# Patient Record
Sex: Female | Born: 1995 | Race: Black or African American | Hispanic: No | Marital: Single | Smoking: Never smoker
Health system: Southern US, Community
[De-identification: ages and names within clinical notes are randomized; demographics above are authoritative.]

## PROBLEM LIST (undated history)

## (undated) ENCOUNTER — Inpatient Hospital Stay (HOSPITAL_COMMUNITY): Payer: Self-pay

## (undated) DIAGNOSIS — Z8619 Personal history of other infectious and parasitic diseases: Secondary | ICD-10-CM

## (undated) DIAGNOSIS — K08409 Partial loss of teeth, unspecified cause, unspecified class: Secondary | ICD-10-CM

## (undated) HISTORY — PX: WISDOM TOOTH EXTRACTION: SHX21

---
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
## 2015-05-22 LAB — PROCEDURE REPORT - SCANNED: Pap: NEGATIVE

## 2015-06-11 ENCOUNTER — Encounter (HOSPITAL_COMMUNITY): Payer: Self-pay | Admitting: *Deleted

## 2015-06-11 ENCOUNTER — Inpatient Hospital Stay (HOSPITAL_COMMUNITY)
Admission: AD | Admit: 2015-06-11 | Discharge: 2015-06-11 | Disposition: A | Discharge status: Home / Self Care | Payer: Medicaid Other | Source: Ambulatory Visit | Attending: Obstetrics | Admitting: Obstetrics

## 2015-06-11 DIAGNOSIS — O98812 Other maternal infectious and parasitic diseases complicating pregnancy, second trimester: Secondary | ICD-10-CM | POA: Insufficient documentation

## 2015-06-11 DIAGNOSIS — B373 Candidiasis of vulva and vagina: Secondary | ICD-10-CM | POA: Insufficient documentation

## 2015-06-11 DIAGNOSIS — O23592 Infection of other part of genital tract in pregnancy, second trimester: Secondary | ICD-10-CM | POA: Diagnosis not present

## 2015-06-11 DIAGNOSIS — N760 Acute vaginitis: Secondary | ICD-10-CM | POA: Insufficient documentation

## 2015-06-11 DIAGNOSIS — R109 Unspecified abdominal pain: Secondary | ICD-10-CM | POA: Diagnosis not present

## 2015-06-11 DIAGNOSIS — Z3A16 16 weeks gestation of pregnancy: Secondary | ICD-10-CM | POA: Diagnosis not present

## 2015-06-11 DIAGNOSIS — R5383 Other fatigue: Secondary | ICD-10-CM | POA: Diagnosis present

## 2015-06-11 DIAGNOSIS — B3731 Acute candidiasis of vulva and vagina: Secondary | ICD-10-CM

## 2015-06-11 DIAGNOSIS — J029 Acute pharyngitis, unspecified: Secondary | ICD-10-CM | POA: Diagnosis not present

## 2015-06-11 LAB — URINALYSIS, ROUTINE W REFLEX MICROSCOPIC
BILIRUBIN URINE: NEGATIVE
GLUCOSE, UA: NEGATIVE mg/dL
HGB URINE DIPSTICK: NEGATIVE
Ketones, ur: NEGATIVE mg/dL
Nitrite: NEGATIVE
Protein, ur: NEGATIVE mg/dL
Specific Gravity, Urine: 1.025 (ref 1.005–1.030)
pH: 6 (ref 5.0–8.0)

## 2015-06-11 LAB — URINE MICROSCOPIC-ADD ON

## 2015-06-11 LAB — WET PREP, GENITAL
CLUE CELLS WET PREP: NONE SEEN
Sperm: NONE SEEN
TRICH WET PREP: NONE SEEN

## 2015-06-11 MED ORDER — FLUCONAZOLE 150 MG PO TABS: 150.0000 mg | ORAL_TABLET | Freq: Once | ORAL | Status: DC

## 2015-06-11 MED ORDER — ACETAMINOPHEN 500 MG PO TABS
1000.0000 mg | ORAL_TABLET | Freq: Once | ORAL | Status: AC
  Administered 2015-06-11: 1000 mg via ORAL
  Filled 2015-06-11: qty 2

## 2015-06-11 NOTE — MAU Note (Signed)
Neck was stiff yesterday and sore throat since yesterday.  Has a small white bump near vagina.  Has had abdominal pain and spotting for a little over a week. Denies LOF.

## 2015-06-11 NOTE — Discharge Instructions (Signed)

## 2015-06-11 NOTE — MAU Provider Note (Signed)
History     CSN: [id number]  Arrival date and time: 06/11/15 1243   First Provider Initiated Contact with Patient 06/11/15 1331      Chief Complaint  Patient presents with  . neck stiff   . Fatigue  . Sore Throat  . Abdominal Pain  . Vaginal Bleeding   HPI Comments: Amanda [name] is a 20 y.o. G2P1001 at 3830w6d by outside [place] presenting with H/A, N/occasional vomiting, not feeling well, spotting for over 1 month, and concerned about possible STI (but states cultures done 3 wks ago and no sexual activity since).   ([place]    Past Medical History  Diagnosis Date  . Hx of wisdom tooth extraction   . Hx of chlamydia infection 05/04/14    treated, TOC-neg  . Hx of gonorrhea 05/04/14    treated, TOC-neg    Past Surgical History  Procedure Laterality Date  . Wisdom tooth extraction      History reviewed. No pertinent family history.  Social History  Substance Use Topics  . Smoking status: Never Smoker   . Smokeless tobacco: None  . Alcohol Use: No    Allergies: No Known Allergies  No prescriptions prior to admission    Review of Systems  Constitutional: Negative for fever and chills.  HENT: Positive for sore throat.        Frontal pressure H/A. Took a Tylenol yesterday with some relief. No tx today. Had sore throat yesterday, better today. Neck was stiff yesterday, now resolved.  Respiratory: Negative for shortness of breath.   Cardiovascular: Negative for chest pain.  Gastrointestinal: Positive for nausea, vomiting and abdominal pain.  Genitourinary: Negative for dysuria, urgency, frequency, hematuria and flank pain.       Irritative and pruritic white vaginal discharge  Neurological: Positive for headaches. Negative for dizziness.  Psychiatric/Behavioral: Positive for depression.   Physical Exam   Blood pressure 107/67, pulse 101, temperature 97.5 F (36.4 C), temperature source Oral, resp. rate 16, height 5' 1.5" (1.562 m), weight 131 lb 12.8 oz  (59.784 kg), last menstrual period 11/02/2014, not currently breastfeeding. DT FHR 160  Physical Exam  Nursing note and vitals reviewed. Constitutional: She is oriented to person, place, and time. She appears well-developed and well-nourished. No distress.  HENT:  Head: Normocephalic.  Eyes: Pupils are equal, round, and reactive to light.  Throa clear. No neck adenopathy  Neck: Normal range of motion. Neck supple. No tracheal deviation present.  Cardiovascular: Normal rate.   Respiratory: Effort normal.  GI: Soft. There is no tenderness. There is no guarding.  Fundus at [id number]  Genitourinary: Vaginal discharge found.  Mod white D/C. No blood.  Musculoskeletal: Normal range of motion.  Neurological: She is alert and oriented to person, place, and time.  Skin: Skin is warm and dry.  Psychiatric: She has a normal mood and affect.  Spec: Moderate amount thick white discharge, vaginal mucosa normal withuot lesions, cx clean, No blood SVE: cx long, closed MAU Course  Procedures Results for orders placed or performed during the hospital encounter of 06/11/15 (from the past 24 hour(s))  Urinalysis, Routine w reflex microscopic (not [facility])     Status: Abnormal   Collection Time: 06/11/15 12:43 PM  Result Value Ref Range   Color, Urine AMBER (A) YELLOW   APPearance CLEAR CLEAR   Specific Gravity, Urine 1.025 1.005 - 1.030   pH 6.0 5.0 - 8.0   Glucose, UA NEGATIVE NEGATIVE mg/dL   Hgb urine dipstick  NEGATIVE NEGATIVE   Bilirubin Urine NEGATIVE NEGATIVE   Ketones, ur NEGATIVE NEGATIVE mg/dL   Protein, ur NEGATIVE NEGATIVE mg/dL   Nitrite NEGATIVE NEGATIVE   Leukocytes, UA SMALL (A) NEGATIVE  Urine microscopic-add on     Status: Abnormal   Collection Time: 06/11/15 12:43 PM  Result Value Ref Range   Squamous Epithelial / LPF TOO NUMEROUS TO COUNT (A) NONE SEEN   WBC, UA 6-30 0 - 5 WBC/hpf   RBC / HPF 0-5 0 - 5 RBC/hpf   Bacteria, UA MANY (A) NONE SEEN   Urine-Other MUCOUS PRESENT    Wet prep, genital     Status: Abnormal   Collection Time: 06/11/15  2:01 PM  Result Value Ref Range   Yeast Wet Prep HPF POC PRESENT (A) NONE SEEN   Trich, Wet Prep NONE SEEN NONE SEEN   Clue Cells Wet Prep HPF POC NONE SEEN NONE SEEN   WBC, Wet Prep HPF POC MANY (A) NONE SEEN   Sperm NONE SEEN    Acetaminophen  po with relief of H?A  Assessment and Plan  Yeast vaginitis G2P1001 at [redacted]w[redacted]d,  Hx vaginal spotting, not evident H/A , tension    Medication List    TAKE these medications        acetaminophen 325 MG tablet  Commonly known as:  TYLENOL  Take 325 mg by mouth every 6 (six) hours as needed for moderate pain or headache.     fluconazole 150 MG tablet  Commonly known as:  DIFLUCAN  Take 1 tablet (150 mg total) by mouth once.     prenatal multivitamin Tabs tablet  Take 1 tablet by mouth daily at 12 noon.       Follow-up Information    Follow up with MARSHALL,BERNARD A, MD In 1 week.   Specialty:  Obstetrics and Gynecology   Why:  Keep your scheduled prenatal appointment   Contact information:   [address] [place] [address] [phone]     Advised to go get NOB lab panel today Amanda [name] 06/11/2015, 1:35 PM

## 2015-06-27 ENCOUNTER — Other Ambulatory Visit (HOSPITAL_COMMUNITY): Payer: Self-pay | Admitting: Obstetrics

## 2015-06-27 DIAGNOSIS — Z3A19 19 weeks gestation of pregnancy: Secondary | ICD-10-CM

## 2015-06-27 DIAGNOSIS — Z3689 Encounter for other specified antenatal screening: Secondary | ICD-10-CM

## 2015-07-01 ENCOUNTER — Ambulatory Visit (HOSPITAL_COMMUNITY): Admission: RE | Admit: 2015-07-01 | Payer: Medicaid Other | Source: Ambulatory Visit

## 2015-09-05 ENCOUNTER — Encounter: Payer: Self-pay | Admitting: Obstetrics

## 2015-09-06 ENCOUNTER — Ambulatory Visit (INDEPENDENT_AMBULATORY_CARE_PROVIDER_SITE_OTHER): Payer: Medicaid Other | Admitting: Certified Nurse Midwife

## 2015-09-06 ENCOUNTER — Encounter: Payer: Self-pay | Admitting: Certified Nurse Midwife

## 2015-09-06 VITALS — BP 98/57 | HR 80 | Wt 147.5 lb

## 2015-09-06 DIAGNOSIS — Z3490 Encounter for supervision of normal pregnancy, unspecified, unspecified trimester: Secondary | ICD-10-CM | POA: Insufficient documentation

## 2015-09-06 DIAGNOSIS — O09899 Supervision of other high risk pregnancies, unspecified trimester: Secondary | ICD-10-CM | POA: Insufficient documentation

## 2015-09-06 DIAGNOSIS — Z3480 Encounter for supervision of other normal pregnancy, unspecified trimester: Secondary | ICD-10-CM

## 2015-09-06 DIAGNOSIS — Z3483 Encounter for supervision of other normal pregnancy, third trimester: Secondary | ICD-10-CM | POA: Diagnosis present

## 2015-09-06 LAB — POCT URINALYSIS DIP (DEVICE)
BILIRUBIN URINE: NEGATIVE
Glucose, UA: NEGATIVE mg/dL
Hgb urine dipstick: NEGATIVE
Ketones, ur: NEGATIVE mg/dL
NITRITE: NEGATIVE
PH: 6.5 (ref 5.0–8.0)
Protein, ur: 30 mg/dL — AB
Specific Gravity, Urine: 1.025 (ref 1.005–1.030)
UROBILINOGEN UA: 1 mg/dL (ref 0.0–1.0)

## 2015-09-06 MED ORDER — TETANUS-DIPHTH-ACELL PERTUSSIS 5-2.5-18.5 LF-MCG/0.5 IM SUSP: 0.5000 mL | Freq: Once | INTRAMUSCULAR | Status: DC

## 2015-09-06 NOTE — Progress Notes (Signed)
Patient does not intend to breastfeed Need ROI from Dr Elsie [facility] Need Tdap

## 2015-09-06 NOTE — Progress Notes (Signed)
Amanda [name] is a G2P1001 6474w2d being seen today for her first obstetrical visit.  Her obstetrical history is significant for short pregnancy interval. Patient does not intend to breast feed. Pregnancy history fully reviewed.  Patient reports no complaints.  Filed Vitals:   09/06/15 0851  BP: 98/57  Pulse: 80  Weight: 147 lb 8 oz (66.906 kg)    HISTORY: OB History  Gravida Para Term Preterm AB SAB TAB Ectopic Multiple Living  2 1 1       0 1    # Outcome Date GA Lbr Len/2nd Weight Sex Delivery Anes PTL Lv  2 Current           1 Term 06/23/14 4930w6d 02:00 / 00:52 6 lb 14.1 oz (3.12 kg) M Vag-Spont EPI  Y     Past Medical History  Diagnosis Date  . Hx of wisdom tooth extraction   . Hx of chlamydia infection 05/04/14    treated, TOC-neg  . Hx of gonorrhea 05/04/14    treated, TOC-neg   Past Surgical History  Procedure Laterality Date  . Wisdom tooth extraction     Family History  Problem Relation Age of Onset  . Adopted: Yes  . Cancer Mother     pseudotumor in brain     Exam                                          Skin: normal coloration and turgor, no rashes    Neurologic: oriented, normal   Extremities: normal strength, tone, and muscle mass   HEENT PERRLA   Mouth/Teeth mucous membranes moist, pharynx normal without lesions   Neck supple   Cardiovascular: regular rate and rhythm, no murmurs or gallops   Respiratory:  appears well, vitals normal, no respiratory distress, acyanotic, normal RR, ear and throat exam is normal, chest clear, no wheezing, crepitations, rhonchi, normal symmetric air entry   Abdomen: soft, non-tender; bowel sounds normal; no masses,  no organomegaly   Urinary:       Assessment:    Pregnancy: G2P1001 Patient Active Problem List   Diagnosis Date Noted  . Supervision of normal pregnancy, antepartum 09/06/2015  . Short interval between pregnancies affecting pregnancy, antepartum 09/06/2015      Plan:   Request for  records Problem list reviewed and updated. Follow up in 2 weeks.   Donette LarryMelanie Estiben Mizuno, CNM 09/06/2015     Subjective:    Amanda [name] is a G2P1001 7274w2d being seen today for her first obstetrical visit.  Her obstetrical history is significant for short pregnancy interval. Patient does not intend to breast feed. Pregnancy history fully reviewed.  Patient reports no complaints.  Filed Vitals:   09/06/15 0851  BP: 98/57  Pulse: 80  Weight: 147 lb 8 oz (66.906 kg)    HISTORY: OB History  Gravida Para Term Preterm AB SAB TAB Ectopic Multiple Living  2 1 1       0 1    # Outcome Date GA Lbr Len/2nd Weight Sex Delivery Anes PTL Lv  2 Current           1 Term 06/23/14 7430w6d 02:00 / 00:52 6 lb 14.1 oz (3.12 kg) M Vag-Spont EPI  Y     Past Medical History  Diagnosis Date  . Hx of wisdom tooth extraction   . Hx of chlamydia infection 05/04/14    treated, TOC-neg  .  Hx of gonorrhea 05/04/14    treated, TOC-neg   Past Surgical History  Procedure Laterality Date  . Wisdom tooth extraction     Family History  Problem Relation Age of Onset  . Adopted: Yes  . Cancer Mother     pseudotumor in brain     Exam                                           Skin: normal coloration and turgor, no rashes    Neurologic: oriented, normal   Extremities: normal strength, tone, and muscle mass   HEENT PERRLA   Mouth/Teeth mucous membranes moist, pharynx normal without lesions   Neck supple   Cardiovascular: regular rate and rhythm, no murmurs or gallops   Respiratory:  appears well, vitals normal, no respiratory distress, acyanotic, normal RR, ear and throat exam is normal, chest clear, no wheezing, crepitations, rhonchi, normal symmetric air entry   Abdomen: soft, non-tender; bowel sounds normal; no masses,  no organomegaly   Urinary:       Assessment:    Pregnancy: G2P1001 Patient Active Problem List   Diagnosis Date Noted  . Supervision of normal pregnancy, antepartum  09/06/2015  . Short interval between pregnancies affecting pregnancy, antepartum 09/06/2015      Plan:     Request for records Problem list reviewed and updated. Follow up in 2 weeks.   Donette LarryMelanie Vondra Aldredge 09/06/2015

## 2015-09-18 ENCOUNTER — Encounter: Payer: Self-pay | Admitting: Obstetrics

## 2015-09-19 ENCOUNTER — Inpatient Hospital Stay (HOSPITAL_COMMUNITY)
Admission: AD | Admit: 2015-09-19 | Discharge: 2015-09-19 | Disposition: A | Discharge status: Home / Self Care | Payer: Medicaid Other | Source: Ambulatory Visit | Attending: Family Medicine | Admitting: Family Medicine

## 2015-09-19 ENCOUNTER — Encounter (HOSPITAL_COMMUNITY): Payer: Self-pay | Admitting: *Deleted

## 2015-09-19 DIAGNOSIS — H538 Other visual disturbances: Secondary | ICD-10-CM | POA: Insufficient documentation

## 2015-09-19 DIAGNOSIS — O26853 Spotting complicating pregnancy, third trimester: Secondary | ICD-10-CM | POA: Diagnosis not present

## 2015-09-19 DIAGNOSIS — O26893 Other specified pregnancy related conditions, third trimester: Secondary | ICD-10-CM | POA: Diagnosis present

## 2015-09-19 DIAGNOSIS — Z3A31 31 weeks gestation of pregnancy: Secondary | ICD-10-CM | POA: Diagnosis not present

## 2015-09-19 DIAGNOSIS — N898 Other specified noninflammatory disorders of vagina: Secondary | ICD-10-CM | POA: Diagnosis not present

## 2015-09-19 LAB — CBC WITH DIFFERENTIAL/PLATELET
BASOS PCT: 0 %
Basophils Absolute: 0 10*3/uL (ref 0.0–0.1)
EOS PCT: 1 %
Eosinophils Absolute: 0.1 10*3/uL (ref 0.0–0.7)
HCT: 31.4 % — ABNORMAL LOW (ref 36.0–46.0)
Hemoglobin: 10.9 g/dL — ABNORMAL LOW (ref 12.0–15.0)
LYMPHS ABS: 3.1 10*3/uL (ref 0.7–4.0)
Lymphocytes Relative: 39 %
MCH: 29.9 pg (ref 26.0–34.0)
MCHC: 34.7 g/dL (ref 30.0–36.0)
MCV: 86 fL (ref 78.0–100.0)
Monocytes Absolute: 0.4 10*3/uL (ref 0.1–1.0)
Monocytes Relative: 5 %
Neutro Abs: 4.3 10*3/uL (ref 1.7–7.7)
Neutrophils Relative %: 55 %
PLATELETS: 167 10*3/uL (ref 150–400)
RBC: 3.65 MIL/uL — ABNORMAL LOW (ref 3.87–5.11)
RDW: 13.1 % (ref 11.5–15.5)
WBC: 8 10*3/uL (ref 4.0–10.5)

## 2015-09-19 LAB — COMPREHENSIVE METABOLIC PANEL
ALBUMIN: 3.3 g/dL — AB (ref 3.5–5.0)
ALT: 12 U/L — ABNORMAL LOW (ref 14–54)
ANION GAP: 9 (ref 5–15)
AST: 18 U/L (ref 15–41)
Alkaline Phosphatase: 78 U/L (ref 38–126)
BUN: 8 mg/dL (ref 6–20)
CO2: 22 mmol/L (ref 22–32)
Calcium: 8.8 mg/dL — ABNORMAL LOW (ref 8.9–10.3)
Chloride: 103 mmol/L (ref 101–111)
Creatinine, Ser: 0.61 mg/dL (ref 0.44–1.00)
GFR calc non Af Amer: >60 mL/min (ref >60)
Glucose, Bld: 97 mg/dL (ref 65–99)
Potassium: 3.6 mmol/L (ref 3.5–5.1)
Sodium: 134 mmol/L — ABNORMAL LOW (ref 135–145)
Total Bilirubin: 0.6 mg/dL (ref 0.3–1.2)
Total Protein: 7.7 g/dL (ref 6.5–8.1)

## 2015-09-19 LAB — URINE MICROSCOPIC-ADD ON

## 2015-09-19 LAB — URINALYSIS, ROUTINE W REFLEX MICROSCOPIC
BILIRUBIN URINE: NEGATIVE
Glucose, UA: NEGATIVE mg/dL
Hgb urine dipstick: NEGATIVE
KETONES UR: 15 mg/dL — AB
NITRITE: NEGATIVE
PROTEIN: 30 mg/dL — AB
Specific Gravity, Urine: 1.01 (ref 1.005–1.030)
pH: 6.5 (ref 5.0–8.0)

## 2015-09-19 MED ORDER — HYDROXYZINE HCL 10 MG PO TABS
10.0000 mg | ORAL_TABLET | Freq: Once | ORAL | Status: AC
  Administered 2015-09-19: 10 mg via ORAL
  Filled 2015-09-19: qty 1

## 2015-09-19 NOTE — Discharge Instructions (Signed)
Blurred Vision  Having blurred vision means that you cannot see things clearly. Your vision may seem fuzzy or out of focus. Blurred vision is a very common symptom of an eye or vision problem. Blurred vision is often a gradual blur that occurs in one eye or both eyes. There are many causes of blurred vision, including cataracts, macular degeneration, and diabetic retinopathy.  Blurred vision can be diagnosed based on your symptoms and a physical exam. Tell your health care provider about any other health problems you have, any recent eye injury, and any prior surgeries. You may need to see a health care provider who specializes in eye problems (ophthalmologist). Your treatment depends on what is causing your blurred vision.   HOME CARE INSTRUCTIONS   Tell your health care provider about any changes in your blurred vision.   Do not drive or operate heavy machinery if your vision is blurry.   Keep all follow-up visits as directed by your health care provider. This is important.  SEEK MEDICAL CARE IF:   Your symptoms get worse.   You have new symptoms.   You have trouble seeing at night.   You have trouble seeing up close or far away.   You have trouble noticing the difference between colors.  SEEK IMMEDIATE MEDICAL CARE IF:   You have severe eye pain.   You have a severe headache.   You have flashing lights in your field of vision.   You have a sudden change in vision.   You have a sudden loss of vision.   You have vision change after an injury.   You notice drainage coming from your eyes.   You notice a rash around your eyes.     This information is not intended to replace advice given to you by your health care provider. Make sure you discuss any questions you have with your health care provider.     Document Released: 02/19/2003 Document Revised: 07/03/2014 Document Reviewed: 01/10/2014  [employer] Interactive Patient Education 2016 [employer]

## 2015-09-19 NOTE — MAU Provider Note (Signed)
History     CSN: [id number]  Arrival date and time: 09/19/15 1916   None     No chief complaint on file.  HPI Pt is 20 y.o.[redacted]w[redacted]d G2P1001 who presents with pink spots- happened early on in pregnancy and then 2 weeks ago - today pt had blurred vision, Pt had spotting 6 days ago- noticed when went to bathroom Pt also has heavy vaginal discharge- watery, mucous. Pt has not had recent intercourse. RN note:   Editor: Dionne Milo, RN (Registered Nurse)     Expand All Collapse All   PT SAYS SHE HAS BEEN SEEING PINK DOTS - STARTED IN June - JUST SWITCHED TO CLINIC- NEXT APPOINTMENT IS 7-25. DOES NOT SEE PINK DOTS NOW. BUT VISION IS BLURRY- STARTED IN June. SAYS FACE AND FEET ARE SWOLLEN- ON ASSESSMENT IN TRIAGE - NO SWELLING IN FEET.  HEADACHE STARTED - THIS MORN- TOOK 2 XS TYLENOL AT 6PM.- NO RELIEF. BACK HURTS- ALL TIME.       Past Medical History  Diagnosis Date  . Hx of wisdom tooth extraction   . Hx of chlamydia infection 05/04/14    treated, TOC-neg  . Hx of gonorrhea 05/04/14    treated, TOC-neg    Past Surgical History  Procedure Laterality Date  . Wisdom tooth extraction      Family History  Problem Relation Age of Onset  . Adopted: Yes  . Cancer Mother     pseudotumor in brain    Social History  Substance Use Topics  . Smoking status: Never Smoker   . Smokeless tobacco: Never Used  . Alcohol Use: No    Allergies: No Known Allergies  Prescriptions prior to admission  Medication Sig Dispense Refill Last Dose  . Prenatal Vit-Fe Fumarate-FA (PRENATAL MULTIVITAMIN) TABS tablet Take 1 tablet by mouth daily at 12 noon.   Taking    Review of Systems  Constitutional: Positive for chills. Negative for fever.  Eyes: Positive for blurred vision and photophobia.  Gastrointestinal: Positive for nausea and vomiting.  Genitourinary: Negative for dysuria.  Neurological: Positive for  dizziness, tingling and headaches.       Tingling in hands and feet when she is nauseated   Physical Exam   Blood pressure 101/56, pulse 88, temperature 97.8 F (36.6 C), temperature source Oral, resp. rate 20, height  (1.549 m), weight 149 lb 8 oz (67.813 kg), last menstrual period 02/15/2015, not currently breastfeeding.  Physical Exam  Nursing note and vitals reviewed. Constitutional: She is oriented to person, place, and time. She appears well-developed and well-nourished. No distress.  HENT:  Head: Normocephalic.  Eyes: Conjunctivae are normal. Pupils are equal, round, and reactive to light.  Neck: Normal range of motion. Neck supple.  Cardiovascular: Normal rate.   Respiratory: Effort normal.  GI: Soft. She exhibits no distension. There is no tenderness. There is no rebound and no guarding.  Musculoskeletal: Normal range of motion.  Neurological: She is alert and oriented to person, place, and time. Coordination normal.  Skin: Skin is warm and dry.  Psychiatric: She has a normal mood and affect.    MAU Course  Procedures Diffuse headache not relieved with 2 extra strength Tylenol 2 hours previously Vistaril  PO given Blood pressure 101/56 P 88 no neuro deficit noted on exam Care turned over to Dr. Chanetta Marshall and CNM Results for orders placed or performed during the hospital encounter of 09/19/15 (from the past 24 hour(s))  Urinalysis, Routine w reflex microscopic (not at  [facility])     Status: Abnormal   Collection Time: 09/19/15  7:55 PM  Result Value Ref Range   Color, Urine YELLOW YELLOW   APPearance CLEAR CLEAR   Specific Gravity, Urine 1.010 1.005 - 1.030   pH 6.5 5.0 - 8.0   Glucose, UA NEGATIVE NEGATIVE mg/dL   Hgb urine dipstick NEGATIVE NEGATIVE   Bilirubin Urine NEGATIVE NEGATIVE   Ketones, ur 15 (A) NEGATIVE mg/dL   Protein, ur 30 (A) NEGATIVE mg/dL   Nitrite NEGATIVE NEGATIVE   Leukocytes, UA TRACE (A) NEGATIVE  Urine microscopic-add on     Status:  Abnormal   Collection Time: 09/19/15  7:55 PM  Result Value Ref Range   Squamous Epithelial / LPF 0-5 (A) NONE SEEN   WBC, UA 6-30 0 - 5 WBC/hpf   RBC / HPF 0-5 0 - 5 RBC/hpf   Bacteria, UA FEW (A) NONE SEEN   Urine-Other MUCOUS PRESENT   CBC with Differential     Status: Abnormal (Preliminary result)   Collection Time: 09/19/15  8:55 PM  Result Value Ref Range   WBC 8.0 4.0 - 10.5 K/uL   RBC 3.65 (L) 3.87 - 5.11 MIL/uL   Hemoglobin 10.9 (L) 12.0 - 15.0 g/dL   HCT 29.531.4 (L) 62.136.0 - 30.846.0 %   MCV 86.0 78.0 - 100.0 fL   MCH 29.9 26.0 - 34.0 pg   MCHC 34.7 30.0 - 36.0 g/dL   RDW 65.713.1 84.611.5 - 96.215.5 %   Platelets 167 150 - 400 K/uL   Neutrophils Relative % 55 %   Neutro Abs 4.3 1.7 - 7.7 K/uL   Lymphocytes Relative 39 %   Lymphs Abs 3.1 0.7 - 4.0 K/uL   Monocytes Relative 5 %   Monocytes Absolute 0.4 0.1 - 1.0 K/uL   Eosinophils Relative 1 %   Eosinophils Absolute 0.1 0.0 - 0.7 K/uL   Basophils Relative 0 %   Basophils Absolute 0.0 0.0 - 0.1 K/uL   Other PENDING %   Assessment and Plan    LINEBERRY,SUSAN 09/19/2015, 8:32 PM

## 2015-09-19 NOTE — MAU Note (Signed)
PT  SAYS  SHE HAS BEEN   SEEING  PINK DOTS   - STARTED  IN June - JUST SWITCHED  TO CLINIC-   NEXT APPOINTMENT  IS  7-25.       DOES  NOT  SEE PINK  DOTS  NOW.   BUT      VISION IS  BLURRY-   STARTED IN June.    SAYS FACE   AND  FEET  ARE SWOLLEN-   ON   ASSESSMENT  IN TRIAGE -  NO SWELLING  IN FEET.         HEADACHE   STARTED   -  THIS  MORN-   TOOK 2  XS   TYLENOL  AT  6PM.-   NO RELIEF.       BACK  HURTS-    ALL TIME.

## 2015-09-19 NOTE — MAU Provider Note (Signed)
Assumed care from Pamelia HoitSusan Lineberry, NP.  Pt's HA is gone.  Still feels as if vision is "a little" blurry, otherwise, sx relieved.  [facility]  FHT reactive w/o decels.

## 2015-09-21 LAB — CULTURE, OB URINE
Culture: >=100000 — AB
Special Requests: NORMAL

## 2015-09-24 ENCOUNTER — Ambulatory Visit (INDEPENDENT_AMBULATORY_CARE_PROVIDER_SITE_OTHER): Payer: Medicaid Other | Admitting: Student

## 2015-09-24 VITALS — BP 108/65 | HR 85 | Wt 149.0 lb

## 2015-09-24 DIAGNOSIS — Z3483 Encounter for supervision of other normal pregnancy, third trimester: Secondary | ICD-10-CM | POA: Diagnosis present

## 2015-09-24 DIAGNOSIS — Z23 Encounter for immunization: Secondary | ICD-10-CM | POA: Diagnosis not present

## 2015-09-24 LAB — POCT URINALYSIS DIP (DEVICE)
Bilirubin Urine: NEGATIVE
GLUCOSE, UA: NEGATIVE mg/dL
Hgb urine dipstick: NEGATIVE
KETONES UR: NEGATIVE mg/dL
Nitrite: NEGATIVE
PROTEIN: 30 mg/dL — AB
Specific Gravity, Urine: >=1.03 (ref 1.005–1.030)
UROBILINOGEN UA: 1 mg/dL (ref 0.0–1.0)
pH: 6.5 (ref 5.0–8.0)

## 2015-09-24 NOTE — Progress Notes (Signed)
Subjective:  Amanda [name] is a 20 y.o. G2P1001 at [redacted]w[redacted]d being seen today for ongoing prenatal care.  She is currently monitored for the following issues for this low-risk pregnancy and has Supervision of normal pregnancy, antepartum and Short interval between pregnancies affecting pregnancy, antepartum on her problem list.  Patient reports no complaints.   .  .  Movement: Present. Denies leaking of fluid.   The following portions of the patient's history were reviewed and updated as appropriate: allergies, current medications, past family history, past medical history, past social history, past surgical history and problem list. Problem list updated.  Objective:   Vitals:   09/24/15 1006  BP: 108/65  Pulse: 85  Weight: 149 lb (67.6 kg)    Fetal Status: Fetal Heart Rate (bpm): 161 Fundal Height: 29 cm Movement: Present     General:  Alert, oriented and cooperative. Patient is in no acute distress.  Skin: Skin is warm and dry. No rash noted.   Cardiovascular: Normal heart rate noted  Respiratory: Normal respiratory effort, no problems with respiration noted  Abdomen: Soft, gravid, appropriate for gestational age. Pain/Pressure: Present     Pelvic:  Cervical exam deferred        Extremities: Normal range of motion.  Edema: None  Mental Status: Normal mood and affect. Normal behavior. Normal judgment and thought content.   Urinalysis:      Assessment and Plan:  Pregnancy: G2P1001 at [redacted]w[redacted]d  1. Supervision of normal pregnancy, antepartum, third trimester   Preterm labor symptoms and general obstetric precautions including but not limited to vaginal bleeding, contractions, leaking of fluid and fetal movement were reviewed in detail with the patient. Please refer to After Visit Summary for other counseling recommendations.  Return in about 2 weeks (around 10/08/2015) for Routine OB.   Judeth Horn, NP

## 2015-09-24 NOTE — Patient Instructions (Signed)

## 2015-10-07 ENCOUNTER — Inpatient Hospital Stay (HOSPITAL_COMMUNITY)
Admission: AD | Admit: 2015-10-07 | Discharge: 2015-10-07 | Disposition: A | Discharge status: Home / Self Care | Payer: Medicaid Other | Source: Ambulatory Visit | Attending: Obstetrics & Gynecology | Admitting: Obstetrics & Gynecology

## 2015-10-07 ENCOUNTER — Encounter (HOSPITAL_COMMUNITY): Payer: Self-pay

## 2015-10-07 DIAGNOSIS — B379 Candidiasis, unspecified: Secondary | ICD-10-CM | POA: Diagnosis not present

## 2015-10-07 DIAGNOSIS — Z3A33 33 weeks gestation of pregnancy: Secondary | ICD-10-CM

## 2015-10-07 DIAGNOSIS — B373 Candidiasis of vulva and vagina: Secondary | ICD-10-CM | POA: Diagnosis not present

## 2015-10-07 DIAGNOSIS — Z8619 Personal history of other infectious and parasitic diseases: Secondary | ICD-10-CM | POA: Diagnosis not present

## 2015-10-07 DIAGNOSIS — R112 Nausea with vomiting, unspecified: Secondary | ICD-10-CM | POA: Diagnosis not present

## 2015-10-07 DIAGNOSIS — O98813 Other maternal infectious and parasitic diseases complicating pregnancy, third trimester: Secondary | ICD-10-CM

## 2015-10-07 DIAGNOSIS — B3731 Acute candidiasis of vulva and vagina: Secondary | ICD-10-CM

## 2015-10-07 DIAGNOSIS — O26893 Other specified pregnancy related conditions, third trimester: Secondary | ICD-10-CM | POA: Insufficient documentation

## 2015-10-07 DIAGNOSIS — N898 Other specified noninflammatory disorders of vagina: Secondary | ICD-10-CM | POA: Diagnosis not present

## 2015-10-07 DIAGNOSIS — R197 Diarrhea, unspecified: Secondary | ICD-10-CM | POA: Insufficient documentation

## 2015-10-07 LAB — URINALYSIS, ROUTINE W REFLEX MICROSCOPIC
BILIRUBIN URINE: NEGATIVE
Glucose, UA: NEGATIVE mg/dL
HGB URINE DIPSTICK: NEGATIVE
KETONES UR: 15 mg/dL — AB
NITRITE: NEGATIVE
PH: 7 (ref 5.0–8.0)
Protein, ur: 100 mg/dL — AB
Specific Gravity, Urine: 1.02 (ref 1.005–1.030)

## 2015-10-07 LAB — WET PREP, GENITAL
Clue Cells Wet Prep HPF POC: NONE SEEN
Sperm: NONE SEEN
Trich, Wet Prep: NONE SEEN

## 2015-10-07 LAB — OB RESULTS CONSOLE GC/CHLAMYDIA: Gonorrhea: NEGATIVE

## 2015-10-07 LAB — URINE MICROSCOPIC-ADD ON: RBC / HPF: NONE SEEN RBC/hpf (ref 0–5)

## 2015-10-07 LAB — POCT FERN TEST: POCT Fern Test: NEGATIVE

## 2015-10-07 MED ORDER — MICONAZOLE 1 1200 & 2 MG & % VA KIT: 1.0000 | PACK | Freq: Every day | VAGINAL | 0 refills | Status: AC

## 2015-10-07 MED ORDER — ONDANSETRON 4 MG PO TBDP: 4.0000 mg | ORAL_TABLET | Freq: Three times a day (TID) | ORAL | 0 refills | Status: DC | PRN

## 2015-10-07 NOTE — MAU Note (Signed)
Yesterday lost a tremendous amt of mucous.  Today, has been vomiting and had diarrhea. (both x2). No one else at the home is sick.

## 2015-10-07 NOTE — Discharge Instructions (Signed)

## 2015-10-07 NOTE — MAU Provider Note (Signed)
History     CSN: [id number]  Arrival date and time: 10/07/15 1207   None     Chief Complaint  Patient presents with  . Emesis  . Diarrhea   HPI   20 year old G2 P1 at 4433 and 5 days who presents to the MAU with complaints of nausea vomiting diarrhea and vaginal discharge or leaking of fluid. She reports his all started yesterday after she lost her mucous plug. She reports she has been feeling moist discharged to the day. She is not had to change her pad however. She's having multiple episodes of nausea and vomiting is having difficulty keeping any food down. She is also had several episodes of diarrhea. She denies fevers or chills. She reports no lightheadedness or dizziness. She denies any sick contacts  OB History    Gravida Para Term Preterm AB Living   2 1 1     1    SAB TAB Ectopic Multiple Live Births         0 1      Past Medical History:  Diagnosis Date  . Hx of chlamydia infection 05/04/14   treated, TOC-neg  . Hx of gonorrhea 05/04/14   treated, TOC-neg  . Hx of wisdom tooth extraction     Past Surgical History:  Procedure Laterality Date  . WISDOM TOOTH EXTRACTION      Family History  Problem Relation Age of Onset  . Adopted: Yes  . Cancer Mother     pseudotumor in brain    Social History  Substance Use Topics  . Smoking status: Never Smoker  . Smokeless tobacco: Never Used  . Alcohol use No    Allergies: No Known Allergies  Prescriptions Prior to Admission  Medication Sig Dispense Refill Last Dose  . acetaminophen (TYLENOL) 500 MG tablet Take 1,000 mg by mouth every 6 (six) hours as needed for moderate pain.   09/19/2015 at Unknown time  . Prenatal Vit-Fe Fumarate-FA (PRENATAL MULTIVITAMIN) TABS tablet Take 1 tablet by mouth daily at 12 noon.   09/19/2015 at Unknown time    Review of Systems  Constitutional: Negative for chills, fever and weight loss.  HENT: Negative for ear pain and hearing loss.   Eyes: Negative for blurred vision, double vision  and photophobia.  Respiratory: Negative for cough, hemoptysis and sputum production.   Cardiovascular: Negative for chest pain, palpitations and orthopnea.  Gastrointestinal: Positive for diarrhea and vomiting. Negative for abdominal pain and heartburn.  Genitourinary: Negative for dysuria, frequency and urgency.  Musculoskeletal: Negative for back pain, myalgias and neck pain.  Skin: Negative for itching and rash.  Neurological: Negative for dizziness, tingling, tremors and headaches.  Endo/Heme/Allergies: Negative for environmental allergies. Does not bruise/bleed easily.   Physical Exam   Blood pressure 100/67, pulse 101, temperature 97.7 F (36.5 C), temperature source Oral, resp. rate 18, height 5\' 1"  (1.549 m), weight 149 lb (67.6 kg), last menstrual period 02/15/2015, not currently breastfeeding.  Physical Exam  Constitutional: She is oriented to person, place, and time. She appears well-developed and well-nourished.  HENT:  Head: Normocephalic and atraumatic.  Eyes: EOM are normal. Pupils are equal, round, and reactive to light.  Neck: Normal range of motion. Neck supple.  Cardiovascular: Normal rate, regular rhythm and normal heart sounds.   No murmur heard. Respiratory: Effort normal. No respiratory distress. She has no wheezes. She has rales.  GI: Soft. Bowel sounds are normal. She exhibits no distension. There is tenderness.  Genitourinary:  Genitourinary Comments: Significant thick  yellowish discharge noted in the vaginal vault. No cervical motion tenderness ferning was done and is negative  Neurological: She is alert and oriented to person, place, and time.  Skin: She is not diaphoretic.    MAU Course  Procedures  MDM The MAU patient had a speculum examination revealed copious thick white discharge. Patient complained of no more leaking. She had no significant nausea while present but was asking if she could get something to go home with. He was done and was found to  be negative.  Assessment and Plan  #1: Viral gastroenteritis advise supportive care. Patient was not tachycardic nor hypotensive. She had no symptoms of lightheadedness. Advised bland food and by mouth hydration. She is given a prescription for Zofran ODT. 2: Questionable rupture membrane. Patient appears to have yeast infection. She was given topical Monistat.  Okay to discharge home.  Ernestina Penna 10/07/2015, 1:03 PM

## 2015-10-08 LAB — GC/CHLAMYDIA PROBE AMP (~~LOC~~) NOT AT ARMC
CHLAMYDIA, DNA PROBE: NEGATIVE
Neisseria Gonorrhea: NEGATIVE

## 2015-10-18 ENCOUNTER — Ambulatory Visit (INDEPENDENT_AMBULATORY_CARE_PROVIDER_SITE_OTHER): Payer: Medicaid Other | Admitting: Obstetrics & Gynecology

## 2015-10-18 VITALS — BP 104/63 | HR 100 | Wt 151.8 lb

## 2015-10-18 DIAGNOSIS — Z3483 Encounter for supervision of other normal pregnancy, third trimester: Secondary | ICD-10-CM

## 2015-10-18 DIAGNOSIS — Z3493 Encounter for supervision of normal pregnancy, unspecified, third trimester: Secondary | ICD-10-CM | POA: Diagnosis present

## 2015-10-18 DIAGNOSIS — O09899 Supervision of other high risk pregnancies, unspecified trimester: Secondary | ICD-10-CM

## 2015-10-18 LAB — POCT URINALYSIS DIP (DEVICE)
Bilirubin Urine: NEGATIVE
GLUCOSE, UA: NEGATIVE mg/dL
KETONES UR: NEGATIVE mg/dL
Nitrite: NEGATIVE
PH: 7 (ref 5.0–8.0)
PROTEIN: NEGATIVE mg/dL
SPECIFIC GRAVITY, URINE: 1.025 (ref 1.005–1.030)
UROBILINOGEN UA: 0.2 mg/dL (ref 0.0–1.0)

## 2015-10-18 NOTE — Progress Notes (Signed)
36 week cultures today 

## 2015-10-18 NOTE — Progress Notes (Signed)
Subjective:  Amanda [name] is a 20 y.o. G2P1001 at 2069w2d being seen today for ongoing prenatal care.  She is currently monitored for the following issues for this low-risk pregnancy and has Supervision of normal pregnancy, antepartum and Short interval between pregnancies affecting pregnancy, antepartum on her problem list.  Patient reports no complaints.  Contractions: Irregular. Vag. Bleeding: None.  Movement: Present. Denies leaking of fluid.   The following portions of the patient's history were reviewed and updated as appropriate: allergies, current medications, past family history, past medical history, past social history, past surgical history and problem list. Problem list updated.  Objective:   Vitals:   10/18/15 0958  BP: 104/63  Pulse: 100  Weight: 151 lb 12.8 oz (68.9 kg)    Fetal Status: Fetal Heart Rate (bpm): 150   Movement: Present     General:  Alert, oriented and cooperative. Patient is in no acute distress.  Skin: Skin is warm and dry. No rash noted.   Cardiovascular: Normal heart rate noted  Respiratory: Normal respiratory effort, no problems with respiration noted  Abdomen: Soft, gravid, appropriate for gestational age. Pain/Pressure: Present     Pelvic:  Cervical exam deferred        Extremities: Normal range of motion.  Edema: None  Mental Status: Normal mood and affect. Normal behavior. Normal judgment and thought content.   Urinalysis:      Assessment and Plan:  Pregnancy: G2P1001 at 3569w2d  1. Supervision of normal pregnancy, third trimester   2. Supervision of normal pregnancy, antepartum, third trimester   3. Short interval between pregnancies affecting pregnancy, antepartum   Preterm labor symptoms and general obstetric precautions including but not limited to vaginal bleeding, contractions, leaking of fluid and fetal movement were reviewed in detail with the patient. Please refer to After Visit Summary for other counseling recommendations.    Return in about 1 week (around 10/25/2015) for cervical cultures.   Allie BossierMyra C Ciena Sampley, MD

## 2015-10-30 ENCOUNTER — Ambulatory Visit (INDEPENDENT_AMBULATORY_CARE_PROVIDER_SITE_OTHER): Payer: Medicaid Other | Admitting: Obstetrics and Gynecology

## 2015-10-30 ENCOUNTER — Encounter (HOSPITAL_COMMUNITY): Payer: Self-pay

## 2015-10-30 ENCOUNTER — Observation Stay (HOSPITAL_COMMUNITY)
Admission: AD | Admit: 2015-10-30 | Discharge: 2015-10-31 | Disposition: A | Discharge status: Home / Self Care | Payer: Medicaid Other | Source: Ambulatory Visit | Attending: Obstetrics and Gynecology | Admitting: Obstetrics and Gynecology

## 2015-10-30 VITALS — BP 97/56 | HR 109 | Wt 152.2 lb

## 2015-10-30 DIAGNOSIS — O9A213 Injury, poisoning and certain other consequences of external causes complicating pregnancy, third trimester: Secondary | ICD-10-CM | POA: Diagnosis present

## 2015-10-30 DIAGNOSIS — Y9302 Activity, running: Secondary | ICD-10-CM | POA: Insufficient documentation

## 2015-10-30 DIAGNOSIS — N859 Noninflammatory disorder of uterus, unspecified: Secondary | ICD-10-CM

## 2015-10-30 DIAGNOSIS — Y999 Unspecified external cause status: Secondary | ICD-10-CM | POA: Insufficient documentation

## 2015-10-30 DIAGNOSIS — W19XXXA Unspecified fall, initial encounter: Secondary | ICD-10-CM | POA: Insufficient documentation

## 2015-10-30 DIAGNOSIS — N858 Other specified noninflammatory disorders of uterus: Secondary | ICD-10-CM

## 2015-10-30 DIAGNOSIS — W010XXA Fall on same level from slipping, tripping and stumbling without subsequent striking against object, initial encounter: Secondary | ICD-10-CM | POA: Insufficient documentation

## 2015-10-30 DIAGNOSIS — Z3490 Encounter for supervision of normal pregnancy, unspecified, unspecified trimester: Secondary | ICD-10-CM

## 2015-10-30 DIAGNOSIS — Z043 Encounter for examination and observation following other accident: Secondary | ICD-10-CM | POA: Insufficient documentation

## 2015-10-30 DIAGNOSIS — Y92009 Unspecified place in unspecified non-institutional (private) residence as the place of occurrence of the external cause: Secondary | ICD-10-CM | POA: Insufficient documentation

## 2015-10-30 DIAGNOSIS — Z3483 Encounter for supervision of other normal pregnancy, third trimester: Secondary | ICD-10-CM

## 2015-10-30 DIAGNOSIS — Z3A37 37 weeks gestation of pregnancy: Secondary | ICD-10-CM | POA: Insufficient documentation

## 2015-10-30 LAB — POCT URINALYSIS DIP (DEVICE)
Bilirubin Urine: NEGATIVE
Glucose, UA: NEGATIVE mg/dL
HGB URINE DIPSTICK: NEGATIVE
Ketones, ur: NEGATIVE mg/dL
NITRITE: NEGATIVE
PH: 7 (ref 5.0–8.0)
PROTEIN: NEGATIVE mg/dL
Specific Gravity, Urine: 1.01 (ref 1.005–1.030)
UROBILINOGEN UA: 0.2 mg/dL (ref 0.0–1.0)

## 2015-10-30 LAB — TYPE AND SCREEN
ABO/RH(D): B POS
Antibody Screen: NEGATIVE

## 2015-10-30 LAB — OB RESULTS CONSOLE GBS: STREP GROUP B AG: POSITIVE

## 2015-10-30 MED ORDER — DOCUSATE SODIUM 100 MG PO CAPS
100.0000 mg | ORAL_CAPSULE | Freq: Every day | ORAL | Status: DC
  Administered 2015-10-30 – 2015-10-31 (×2): 100 mg via ORAL
  Filled 2015-10-30 (×2): qty 1

## 2015-10-30 MED ORDER — CALCIUM CARBONATE ANTACID 500 MG PO CHEW
2.0000 | CHEWABLE_TABLET | ORAL | Status: DC | PRN
  Administered 2015-10-30: 400 mg via ORAL
  Filled 2015-10-30 (×2): qty 2

## 2015-10-30 MED ORDER — PRENATAL MULTIVITAMIN CH
1.0000 | ORAL_TABLET | Freq: Every day | ORAL | Status: DC
Start: 2015-10-31 — End: 2015-10-31
  Administered 2015-10-31: 1 via ORAL
  Filled 2015-10-30: qty 1

## 2015-10-30 MED ORDER — ZOLPIDEM TARTRATE 5 MG PO TABS: 5.0000 mg | ORAL_TABLET | Freq: Every evening | ORAL | Status: DC | PRN

## 2015-10-30 MED ORDER — ACETAMINOPHEN 500 MG PO TABS
1000.0000 mg | ORAL_TABLET | Freq: Once | ORAL | Status: AC
  Administered 2015-10-30: 1000 mg via ORAL
  Filled 2015-10-30: qty 2

## 2015-10-30 MED ORDER — ACETAMINOPHEN 325 MG PO TABS: 650.0000 mg | ORAL_TABLET | ORAL | Status: DC | PRN

## 2015-10-30 MED ORDER — LORATADINE 10 MG PO TABS
10.0000 mg | ORAL_TABLET | Freq: Every day | ORAL | Status: DC
  Administered 2015-10-30 – 2015-10-31 (×2): 10 mg via ORAL
  Filled 2015-10-30 (×3): qty 1

## 2015-10-30 NOTE — MAU Note (Signed)
Sent from office after falling today to be monitored.

## 2015-10-30 NOTE — H&P (Signed)
History     CSN: [id number]  Arrival date and time: 10/30/15 1559   First Provider Initiated Contact with Patient 10/30/15 1724      Chief Complaint  Patient presents with  . Fall   HPI   Ms.Amanda [name] is a 20 y.o. female G2P1001 @ [redacted]w[redacted]d here following a fall she had at home around 1400. She was in a hurry and was running towards her Zenaida Niece. She tripped and dove forward and landed on her right side. She feels that she fell hard. She denies vaginal bleeding, or gushes of water.  She was sent up from the clinic for monitoring following her regular scheduled appointment.   Baby is moving normally.   OB History    Gravida Para Term Preterm AB Living   2 1 1     1    SAB TAB Ectopic Multiple Live Births         0 1      Past Medical History:  Diagnosis Date  . Hx of chlamydia infection 05/04/14   treated, TOC-neg  . Hx of gonorrhea 05/04/14   treated, TOC-neg  . Hx of wisdom tooth extraction     Past Surgical History:  Procedure Laterality Date  . WISDOM TOOTH EXTRACTION      Family History  Problem Relation Age of Onset  . Adopted: Yes  . Cancer Mother     pseudotumor in brain    Social History  Substance Use Topics  . Smoking status: Never Smoker  . Smokeless tobacco: Never Used  . Alcohol use No    Allergies: No Known Allergies  Prescriptions Prior to Admission  Medication Sig Dispense Refill Last Dose  . acetaminophen (TYLENOL) 500 MG tablet Take 1,000 mg by mouth every 6 (six) hours as needed for moderate pain.   10/29/2015 at Unknown time  . Prenatal Vit-Fe Fumarate-FA (PRENATAL MULTIVITAMIN) TABS tablet Take 1 tablet by mouth daily at 12 noon.   Past Week at Unknown time   Results for orders placed or performed in visit on 10/30/15 (from the past 72 hour(s))  POCT urinalysis dip (device)     Status: Abnormal   Collection Time: 10/30/15  3:25 PM  Result Value Ref Range   Glucose, UA NEGATIVE NEGATIVE mg/dL   Bilirubin Urine NEGATIVE NEGATIVE   Ketones,  ur NEGATIVE NEGATIVE mg/dL   Specific Gravity, Urine 1.010 1.005 - 1.030   Hgb urine dipstick NEGATIVE NEGATIVE   pH 7.0 5.0 - 8.0   Protein, ur NEGATIVE NEGATIVE mg/dL   Urobilinogen, UA 0.2 0.0 - 1.0 mg/dL   Nitrite NEGATIVE NEGATIVE   Leukocytes, UA TRACE (A) NEGATIVE    Comment: Biochemical Testing Only. Please order routine urinalysis from main lab if confirmatory testing is needed.    Review of Systems  Constitutional: Negative for chills and fever.  Gastrointestinal: Negative for abdominal pain, nausea and vomiting.  Genitourinary: Negative for dysuria.       Denies vaginal bleeding    Physical Exam   Blood pressure 113/76, pulse 85, temperature 98.1 F (36.7 C), resp. rate 16, last menstrual period 02/15/2015, not currently breastfeeding.  Physical Exam  Constitutional: She is oriented to person, place, and time. She appears well-developed and well-nourished. No distress.  HENT:  Head: Normocephalic.  Respiratory: Effort normal.  GI: Soft. She exhibits no distension. There is no tenderness. There is no rebound.  Musculoskeletal: Normal range of motion.  Neurological: She is alert and oriented to person, place, and time.  Skin:  Skin is warm. She is not diaphoretic.  Psychiatric: Her behavior is normal.   Fetal Tracing: Baseline: 135 bpm  Variability: Moderate  Accelerations: 15x15 Decelerations: None Toco: Frequent UI with occasional contraction   Assessment and Plan   A:  1. Fall, initial encounter   2. Uterine irritability     P:  Admit to Ante for observation  Continuous fetal monitoring  RN to perform cervical exam prior to transfer.    Duane LopeJennifer I Rasch, NP 10/30/2015 8:07 PM

## 2015-10-30 NOTE — Progress Notes (Signed)
Report given to MAU charge nurse and patient taken to MAU for monitoring.

## 2015-10-30 NOTE — Patient Instructions (Signed)

## 2015-10-30 NOTE — Plan of Care (Signed)
Problem: Education: Goal: Knowledge of [facility] Education information/materials will improve Outcome: Progressing Given admission Pt Guide, discussed

## 2015-10-30 NOTE — MAU Provider Note (Signed)
History     CSN: [id number]  Arrival date and time: 10/30/15 1559   First Provider Initiated Contact with Patient 10/30/15 1724      Chief Complaint  Patient presents with  . Fall   HPI   Amanda [name] is a 20 y.o. female G2P1001 @ [redacted]w[redacted]d here following a fall she had at home around 1400. She was in a hurry and was running towards her Amanda [name]. She tripped and dove forward and landed on her right side. She feels that she fell hard. She denies vaginal bleeding, or gushes of water.  She was sent up from the clinic for monitoring following her regular scheduled appointment.   Baby is moving normally.   OB History    Gravida Para Term Preterm AB Living   2 1 1     1    SAB TAB Ectopic Multiple Live Births         0 1      Past Medical History:  Diagnosis Date  . Hx of chlamydia infection 05/04/14   treated, TOC-neg  . Hx of gonorrhea 05/04/14   treated, TOC-neg  . Hx of wisdom tooth extraction     Past Surgical History:  Procedure Laterality Date  . WISDOM TOOTH EXTRACTION      Family History  Problem Relation Age of Onset  . Adopted: Yes  . Cancer Mother     pseudotumor in brain    Social History  Substance Use Topics  . Smoking status: Never Smoker  . Smokeless tobacco: Never Used  . Alcohol use No    Allergies: No Known Allergies  Prescriptions Prior to Admission  Medication Sig Dispense Refill Last Dose  . acetaminophen (TYLENOL) 500 MG tablet Take 1,000 mg by mouth every 6 (six) hours as needed for moderate pain.   10/29/2015 at Unknown time  . Prenatal Vit-Fe Fumarate-FA (PRENATAL MULTIVITAMIN) TABS tablet Take 1 tablet by mouth daily at 12 noon.   Past Week at Unknown time   Results for orders placed or performed in visit on 10/30/15 (from the past 72 hour(s))  POCT urinalysis dip (device)     Status: Abnormal   Collection Time: 10/30/15  3:25 PM  Result Value Ref Range   Glucose, UA NEGATIVE NEGATIVE mg/dL   Bilirubin Urine NEGATIVE NEGATIVE   Ketones,  ur NEGATIVE NEGATIVE mg/dL   Specific Gravity, Urine 1.010 1.005 - 1.030   Hgb urine dipstick NEGATIVE NEGATIVE   pH 7.0 5.0 - 8.0   Protein, ur NEGATIVE NEGATIVE mg/dL   Urobilinogen, UA 0.2 0.0 - 1.0 mg/dL   Nitrite NEGATIVE NEGATIVE   Leukocytes, UA TRACE (A) NEGATIVE    Comment: Biochemical Testing Only. Please order routine urinalysis from main lab if confirmatory testing is needed.    Review of Systems  Constitutional: Negative for chills and fever.  Gastrointestinal: Negative for abdominal pain, nausea and vomiting.  Genitourinary: Negative for dysuria.       Denies vaginal bleeding    Physical Exam   Blood pressure 113/76, pulse 85, temperature 98.1 F (36.7 C), resp. rate 16, last menstrual period 02/15/2015, not currently breastfeeding.  Physical Exam  Constitutional: She is oriented to person, place, and time. She appears well-developed and well-nourished. No distress.  HENT:  Head: Normocephalic.  Respiratory: Effort normal.  GI: Soft. She exhibits no distension. There is no tenderness. There is no rebound.  Musculoskeletal: Normal range of motion.  Neurological: She is alert and oriented to person, place, and time.  Skin:  Skin is warm. She is not diaphoretic.  Psychiatric: Her behavior is normal.   Fetal Tracing: Baseline: 135 bpm  Variability: Moderate  Accelerations: 15x15 Decelerations: None Toco: Frequent UI with occasional contraction   MAU Course  Procedures  None  MDM  Fetal monitoring X 4 hours Discussed fetal tracing with Dr. Jolayne Pantheronstant who recommends 24 hours of observation on Ante.   Assessment and Plan   A:  1. Fall, initial encounter   2. Uterine irritability      P:  Admit to Ante for observation  Continuous fetal monitoring    Duane LopeJennifer I Deundra Bard, NP 10/30/2015 8:07 PM

## 2015-10-30 NOTE — Progress Notes (Signed)
Subjective:  Amanda [name] is a 20 y.o. G2P1001 at 4576w0d being seen today for ongoing prenatal care.  She is currently monitored for the following issues for this low-risk pregnancy and has Supervision of normal pregnancy, antepartum; Short interval between pregnancies affecting pregnancy, antepartum; and Fall on her problem list.  Patient reports that she fell on her right side @ 2:30 today. No Vb, LOF or Ut Ctx since fall.   Contractions: Irregular. Vag. Bleeding: None.  Movement: Present. Denies leaking of fluid.   The following portions of the patient's history were reviewed and updated as appropriate: allergies, current medications, past family history, past medical history, past social history, past surgical history and problem list. Problem list updated.  Objective:   Vitals:   10/30/15 1507  BP: (!) 97/56  Pulse: (!) 109  Weight: 152 lb 3.2 oz (69 kg)    Fetal Status: Fetal Heart Rate (bpm): 150   Movement: Present     General:  Alert, oriented and cooperative. Patient is in no acute distress.  Skin: Skin is warm and dry. No rash noted.   Cardiovascular: Normal heart rate noted  Respiratory: Normal respiratory effort, no problems with respiration noted  Abdomen: Soft, gravid, appropriate for gestational age. Pain/Pressure: Present     Pelvic:  Cervical exam performed        Extremities: Normal range of motion.  Edema: Trace  Mental Status: Normal mood and affect. Normal behavior. Normal judgment and thought content.   Urinalysis:      Assessment and Plan:  Pregnancy: G2P1001 at 5176w0d  1. Supervision of normal pregnancy, antepartum, third trimester  - Culture, beta strep (group b only)  2. Fall, initial encounter Will send to MAU for monitoring.   Term labor symptoms and general obstetric precautions including but not limited to vaginal bleeding, contractions, leaking of fluid and fetal movement were reviewed in detail with the patient. Please refer to After Visit  Summary for other counseling recommendations.  Return in about 1 week (around 11/06/2015) for OB visit.   Hermina StaggersMichael L Evona Westra, MD

## 2015-10-30 NOTE — Progress Notes (Signed)
Patient states fell at home( Room at the INN) when going to your car. Fell - landing on right side.  No bleeding noted. States has felt baby move since she fell. Denies any vaginal bleeding

## 2015-10-31 ENCOUNTER — Observation Stay (HOSPITAL_COMMUNITY): Payer: Medicaid Other

## 2015-10-31 LAB — CULTURE, BETA STREP (GROUP B ONLY)

## 2015-10-31 NOTE — Progress Notes (Signed)
Patient discharged to home per MD order. Patient strip reviewed, 1 acceleration noted at 1232. Pt requested monitors then be removed because "I been trying to get up out of here". RN attempted to educate patient regarding importance of monitoring variability and absence of decels. Pt requested that as soon as one accel was seen, to be discharged. RN complied. Patient voiced understanding of all discharge instructions including when to return for follow up visit and ambulated to car in care of mother.  Perlita Forbush [place] Creek[place] Aeryn Medici, [place] 10/31/2015 1240

## 2015-10-31 NOTE — Discharge Summary (Signed)
Antenatal Physician Discharge Summary  Patient ID: Amanda [name] MRN: [id number] DOB/AGE: 20/10/1995 20 y.o.  Admit date: 10/30/2015 Discharge date: 10/31/2015  Admission Diagnoses: Trauma in pregnancy, fall  ANTENATAL DISCHARGE SUMMARY  Discharge Diagnoses: Normal term pregnancy  Prenatal Procedures: NST and ultrasound  Intrapartum Procedures: Neonatology, Maternal Fetal Medicine Fetal monitoring  Significant Diagnostic Studies:  Results for orders placed or performed during the hospital encounter of 10/30/15 (from the past 168 hour(s))  [facility]   Collection Time: 10/30/15  9:03 PM  Result Value Ref Range   ABO/RH(D) B POS    Antibody Screen NEG    Sample Expiration 11/02/2015   Results for orders placed or performed in visit on 10/30/15 (from the past 168 hour(s))  POCT urinalysis dip (device)   Collection Time: 10/30/15  3:25 PM  Result Value Ref Range   Glucose, UA NEGATIVE NEGATIVE mg/dL   Bilirubin Urine NEGATIVE NEGATIVE   Ketones, ur NEGATIVE NEGATIVE mg/dL   Specific Gravity, Urine 1.010 1.005 - 1.030   Hgb urine dipstick NEGATIVE NEGATIVE   pH 7.0 5.0 - 8.0   Protein, ur NEGATIVE NEGATIVE mg/dL   Urobilinogen, UA 0.2 0.0 - 1.0 mg/dL   Nitrite NEGATIVE NEGATIVE   Leukocytes, UA TRACE (A) NEGATIVE    Treatments: IV hydration  Hospital Course:  This is a 20 y.o. G2P1001 with IUP at 3762w1d admitted for trauma to the abdomen after a fall. She was admitted with contractions and recent trauma, noted to have a cervical exam of 3/60/-2.  No leaking of fluid and no bleeding.  She was observed, fetal heart rate monitoring remained reassuring, and she had no signs/symptoms of abruption or labor or other maternal-fetal concerns.  Her contractions subsided.  She was deemed stable for discharge to home with outpatient follow up.  Discharge Exam: BP 97/74 (BP Location: Left Arm)   Pulse 89   Temp 98 F (36.7 C) (Oral)   Resp 18   Ht  5\' 2"  (1.575 m)   Wt 150 lb (68 kg)   LMP 02/15/2015 (Approximate)   SpO2 100%   BMI 27.44 kg/m  General appearance: alert, cooperative, appears stated age and no distress Head: Normocephalic, without obvious abnormality, atraumatic Neck: no adenopathy, no carotid bruit, no JVD, supple, symmetrical, trachea midline and thyroid not enlarged, symmetric, no tenderness/mass/nodules Back: symmetric, no curvature. ROM normal. No CVA tenderness. Resp: clear to auscultation bilaterally Cardio: regular rate and rhythm, S1, S2 normal, no murmur, click, rub or gallop GI: soft, non-tender; bowel sounds normal; no masses,  no organomegaly Extremities: extremities normal, atraumatic, no cyanosis or edema Skin: Skin color, texture, turgor normal. No rashes or lesions  Discharge Condition: stable  Disposition: 01-Home or Self Care     Medication List    TAKE these medications   acetaminophen 500 MG tablet Commonly known as:  TYLENOL Take 1,000 mg by mouth every 6 (six) hours as needed for moderate pain.   prenatal multivitamin Tabs tablet Take 1 tablet by mouth daily at 12 noon.      Follow-up Information    [facility] Schedule an appointment as soon as possible for a visit in 1 week(s).   Specialty:  Obstetrics and Gynecology Why:  For routine prenatal care Contact information: [address] [place] [id number] [phone]          Signed: Jen Mowlizabeth Elison Worrel, DO OB Fellow 10/31/2015, 11:08 AM

## 2015-10-31 NOTE — Discharge Instructions (Signed)
What Do I Need to Know About Injuries During Pregnancy? °Injuries can happen during pregnancy. Minor falls and accidents usually do not harm you or your baby. However, any injury should be reported to your doctor. °WHAT CAN I DO TO PROTECT MYSELF FROM INJURIES? °· Remove rugs and loose objects on the floor. °· Wear comfortable shoes that have a good grip. Do not wear high-heeled shoes. °· Always wear your seat belt. The lap belt should be below your belly. Always practice safe driving. °· Do not ride on a motorcycle. °· Do not participate in high-impact activities or sports. °· Avoid: °¨ Walking on wet or slippery floors. °¨ Fires. °¨ Starting fires. °¨ Lifting heavy pots of boiling or hot liquids. °¨ Fixing electrical problems. °· Only take medicine as told by your doctor. °· Know your blood type and the blood type of the baby's father. °· Call your local emergency services (911 in the [place] if you are a victim of domestic violence or assault. For help and support, contact the National Domestic Violence Hotline. °WHEN SHOULD I GET HELP RIGHT AWAY? °· You fall on your belly or have any high-impact accident or injury. °· You have been a victim of domestic violence or any kind of violence. °· You have been in a car accident. °· You have bleeding from your vagina. °· Fluid is leaking from your vagina. °· You start to have belly cramping (contractions) or pain. °· You feel weak or pass out (faint). °· You start to throw up (vomit) after an injury. °· You have been burned. °· You have a stiff neck or neck pain. °· You get a headache or have vision problems after an injury. °· You do not feel the baby move or the baby is not moving as much as normal. °  °This information is not intended to replace advice given to you by your health care provider. Make sure you discuss any questions you have with your health care provider. °  °Document Released: 03/21/2010 Document Revised: 03/09/2014 Document Reviewed:  11/23/2012 °Elsevier Interactive Patient Education ©2016 [employer] °Fetal Movement Counts °Patient Name: __________________________________________________ Patient Due Date: ____________________ °Performing a fetal movement count is highly recommended in high-risk pregnancies, but it is good for every pregnant woman to do. Your health care provider may ask you to start counting fetal movements at 28 weeks of the pregnancy. Fetal movements often increase: °· After eating a full meal. °· After physical activity. °· After eating or drinking something sweet or cold. °· At rest. °Pay attention to when you feel the baby is most active. This will help you notice a pattern of your baby's sleep and wake cycles and what factors contribute to an increase in fetal movement. It is important to perform a fetal movement count at the same time each day when your baby is normally most active.  °HOW TO COUNT FETAL MOVEMENTS °1. Find a quiet and comfortable area to sit or lie down on your left side. Lying on your left side provides the best blood and oxygen circulation to your baby. °2. Write down the day and time on a sheet of paper or in a journal. °3. Start counting kicks, flutters, swishes, rolls, or jabs in a 2-hour period. You should feel at least 10 movements within 2 hours. °4. If you do not feel 10 movements in 2 hours, wait 2-3 hours and count again. Look for a change in the pattern or not enough counts in 2 hours. °SEEK MEDICAL   CARE IF: °· You feel less than 10 counts in 2 hours, tried twice. °· There is no movement in over an hour. °· The pattern is changing or taking longer each day to reach 10 counts in 2 hours. °· You feel the baby is not moving as he or she usually does. °Date: ____________ Movements: ____________ Start time: ____________ Finish time: ____________  °Date: ____________ Movements: ____________ Start time: ____________ Finish time: ____________ °Date: ____________ Movements: ____________ Start time:  ____________ Finish time: ____________ °Date: ____________ Movements: ____________ Start time: ____________ Finish time: ____________ °Date: ____________ Movements: ____________ Start time: ____________ Finish time: ____________ °Date: ____________ Movements: ____________ Start time: ____________ Finish time: ____________ °Date: ____________ Movements: ____________ Start time: ____________ Finish time: ____________ °Date: ____________ Movements: ____________ Start time: ____________ Finish time: ____________  °Date: ____________ Movements: ____________ Start time: ____________ Finish time: ____________ °Date: ____________ Movements: ____________ Start time: ____________ Finish time: ____________ °Date: ____________ Movements: ____________ Start time: ____________ Finish time: ____________ °Date: ____________ Movements: ____________ Start time: ____________ Finish time: ____________ °Date: ____________ Movements: ____________ Start time: ____________ Finish time: ____________ °Date: ____________ Movements: ____________ Start time: ____________ Finish time: ____________ °Date: ____________ Movements: ____________ Start time: ____________ Finish time: ____________  °Date: ____________ Movements: ____________ Start time: ____________ Finish time: ____________ °Date: ____________ Movements: ____________ Start time: ____________ Finish time: ____________ °Date: ____________ Movements: ____________ Start time: ____________ Finish time: ____________ °Date: ____________ Movements: ____________ Start time: ____________ Finish time: ____________ °Date: ____________ Movements: ____________ Start time: ____________ Finish time: ____________ °Date: ____________ Movements: ____________ Start time: ____________ Finish time: ____________ °Date: ____________ Movements: ____________ Start time: ____________ Finish time: ____________  °Date: ____________ Movements: ____________ Start time: ____________ Finish time: ____________ °Date:  ____________ Movements: ____________ Start time: ____________ Finish time: ____________ °Date: ____________ Movements: ____________ Start time: ____________ Finish time: ____________ °Date: ____________ Movements: ____________ Start time: ____________ Finish time: ____________ °Date: ____________ Movements: ____________ Start time: ____________ Finish time: ____________ °Date: ____________ Movements: ____________ Start time: ____________ Finish time: ____________ °Date: ____________ Movements: ____________ Start time: ____________ Finish time: ____________  °Date: ____________ Movements: ____________ Start time: ____________ Finish time: ____________ °Date: ____________ Movements: ____________ Start time: ____________ Finish time: ____________ °Date: ____________ Movements: ____________ Start time: ____________ Finish time: ____________ °Date: ____________ Movements: ____________ Start time: ____________ Finish time: ____________ °Date: ____________ Movements: ____________ Start time: ____________ Finish time: ____________ °Date: ____________ Movements: ____________ Start time: ____________ Finish time: ____________ °Date: ____________ Movements: ____________ Start time: ____________ Finish time: ____________  °Date: ____________ Movements: ____________ Start time: ____________ Finish time: ____________ °Date: ____________ Movements: ____________ Start time: ____________ Finish time: ____________ °Date: ____________ Movements: ____________ Start time: ____________ Finish time: ____________ °Date: ____________ Movements: ____________ Start time: ____________ Finish time: ____________ °Date: ____________ Movements: ____________ Start time: ____________ Finish time: ____________ °Date: ____________ Movements: ____________ Start time: ____________ Finish time: ____________ °Date: ____________ Movements: ____________ Start time: ____________ Finish time: ____________  °Date: ____________ Movements: ____________ Start  time: ____________ Finish time: ____________ °Date: ____________ Movements: ____________ Start time: ____________ Finish time: ____________ °Date: ____________ Movements: ____________ Start time: ____________ Finish time: ____________ °Date: ____________ Movements: ____________ Start time: ____________ Finish time: ____________ °Date: ____________ Movements: ____________ Start time: ____________ Finish time: ____________ °Date: ____________ Movements: ____________ Start time: ____________ Finish time: ____________ °Date: ____________ Movements: ____________ Start time: ____________ Finish time: ____________  °Date: ____________ Movements: ____________ Start time: ____________ Finish time: ____________ °Date: ____________ Movements: ____________ Start time: ____________ Finish time: ____________ °Date: ____________ Movements: ____________ Start time: ____________ Finish time: ____________ °Date: ____________ Movements: ____________ Start   time: ____________ Finish time: ____________ °Date: ____________ Movements: ____________ Start time: ____________ Finish time: ____________ °Date: ____________ Movements: ____________ Start time: ____________ Finish time: ____________ °  °This information is not intended to replace advice given to you by your health care provider. Make sure you discuss any questions you have with your health care provider. °  °Document Released: 03/18/2006 Document Revised: 03/09/2014 Document Reviewed: 12/14/2011 °Elsevier Interactive Patient Education ©2016 [employer] Inc[employer] ° °

## 2015-11-03 ENCOUNTER — Encounter (HOSPITAL_COMMUNITY): Payer: Self-pay | Admitting: *Deleted

## 2015-11-03 ENCOUNTER — Inpatient Hospital Stay (HOSPITAL_COMMUNITY)
Admission: AD | Admit: 2015-11-03 | Discharge: 2015-11-03 | Disposition: A | Discharge status: Home / Self Care | Payer: Medicaid Other | Source: Ambulatory Visit | Attending: Obstetrics & Gynecology | Admitting: Obstetrics & Gynecology

## 2015-11-03 DIAGNOSIS — T1490 Injury, unspecified: Secondary | ICD-10-CM | POA: Diagnosis not present

## 2015-11-03 DIAGNOSIS — O9A213 Injury, poisoning and certain other consequences of external causes complicating pregnancy, third trimester: Secondary | ICD-10-CM | POA: Insufficient documentation

## 2015-11-03 DIAGNOSIS — M545 Low back pain: Secondary | ICD-10-CM | POA: Insufficient documentation

## 2015-11-03 DIAGNOSIS — O9989 Other specified diseases and conditions complicating pregnancy, childbirth and the puerperium: Secondary | ICD-10-CM

## 2015-11-03 DIAGNOSIS — M549 Dorsalgia, unspecified: Secondary | ICD-10-CM | POA: Diagnosis not present

## 2015-11-03 DIAGNOSIS — O26893 Other specified pregnancy related conditions, third trimester: Secondary | ICD-10-CM | POA: Diagnosis not present

## 2015-11-03 DIAGNOSIS — O99891 Other specified diseases and conditions complicating pregnancy: Secondary | ICD-10-CM

## 2015-11-03 IMAGING — US US OB FOLLOW-UP
1 series · 12 of 28 positions shown · non-contrast
Comparison: none

[Series 1: us ob follow up · 12 of 96 slices shown]
[im 4/96]
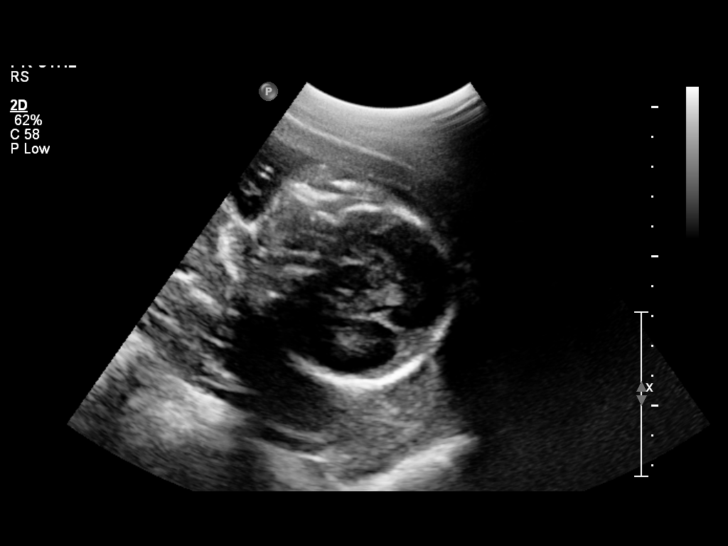
[im 11/96]
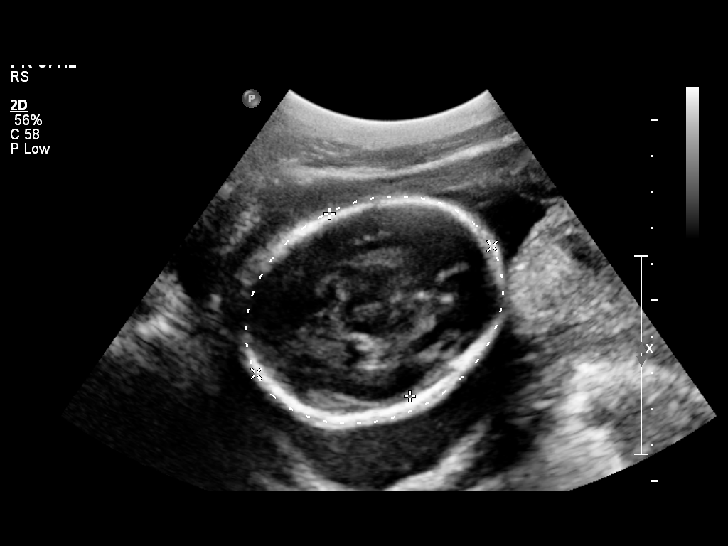
[im 18/96]
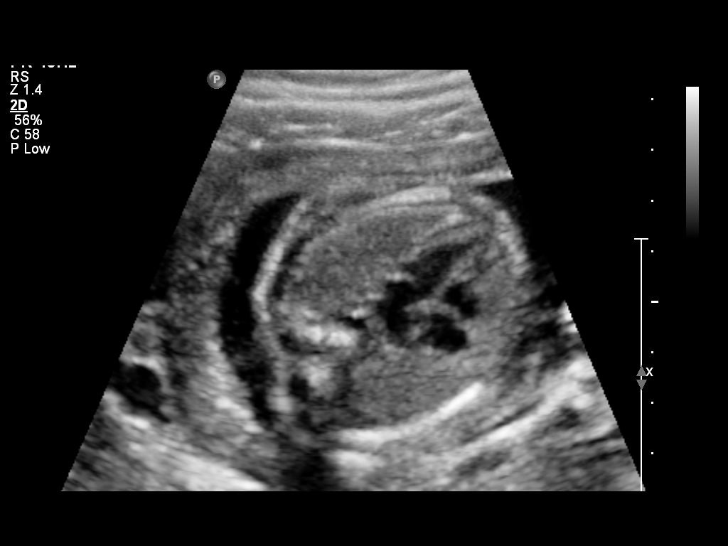
[im 29/96]
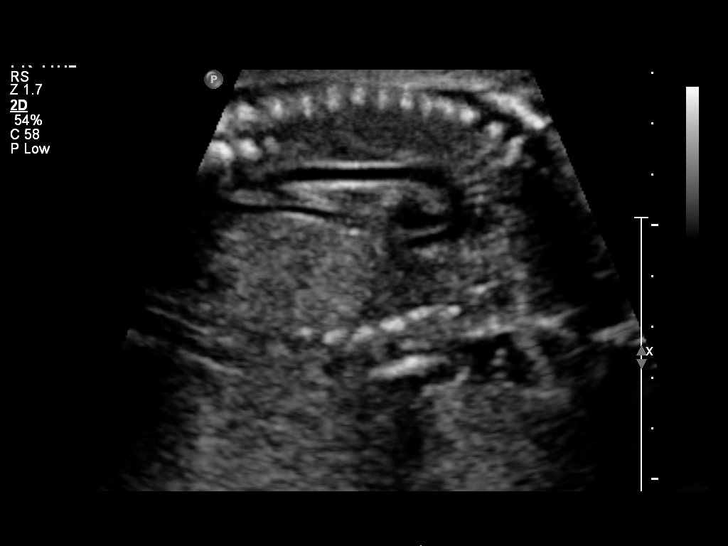
[im 36/96]
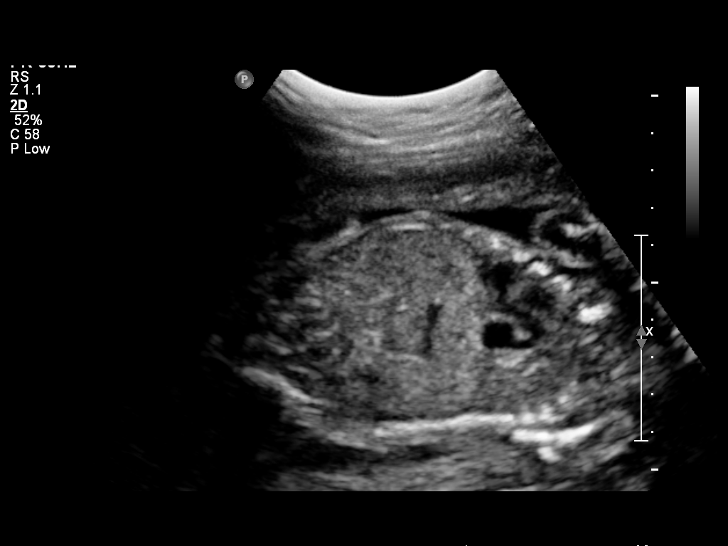
[im 43/96]
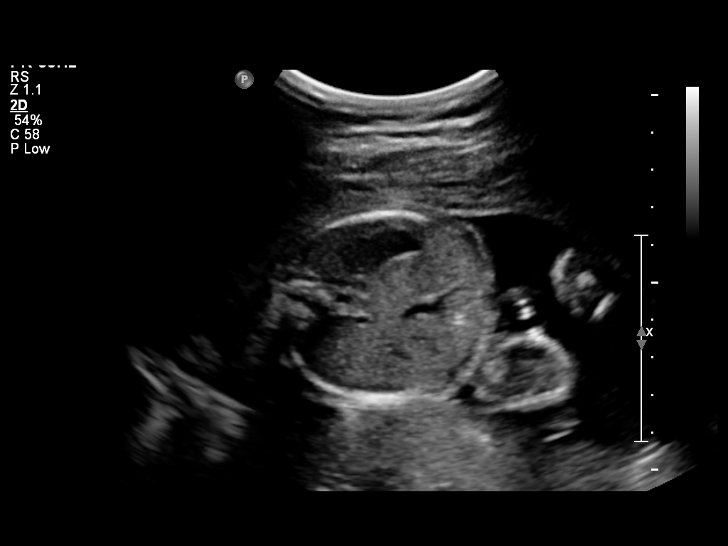
[im 53/96]
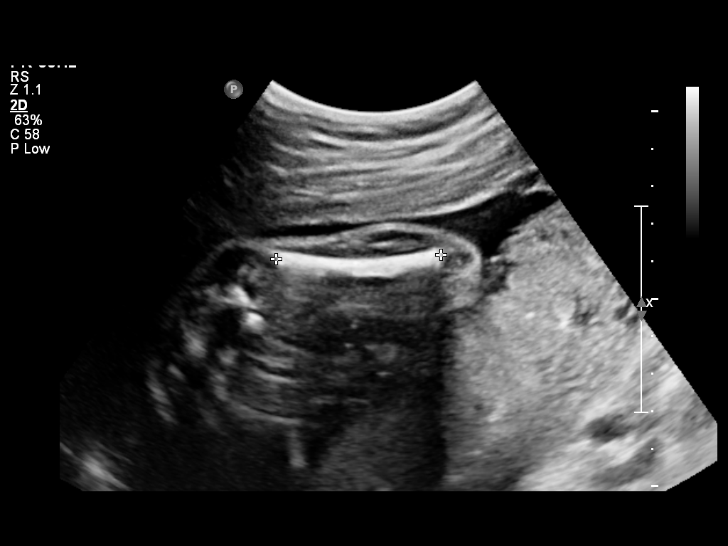
[im 60/96]
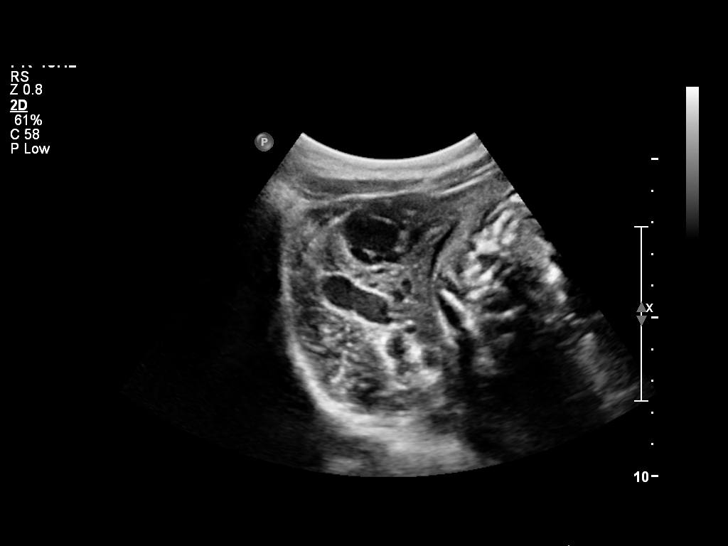
[im 67/96]
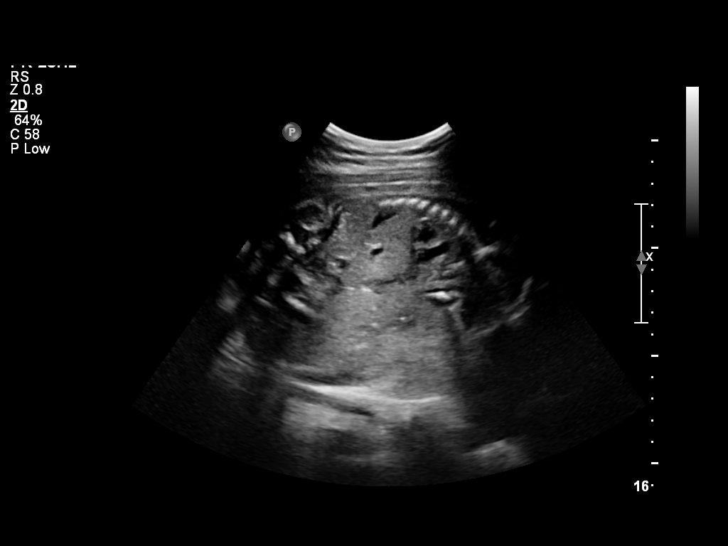
[im 78/96]
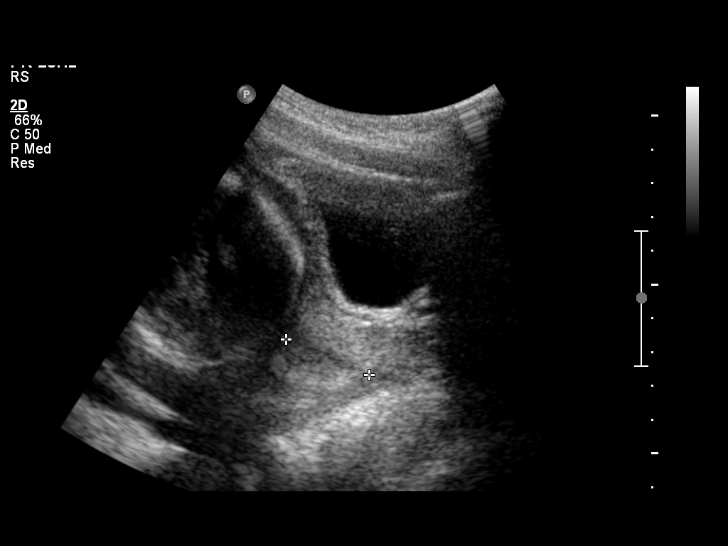
[im 85/96]
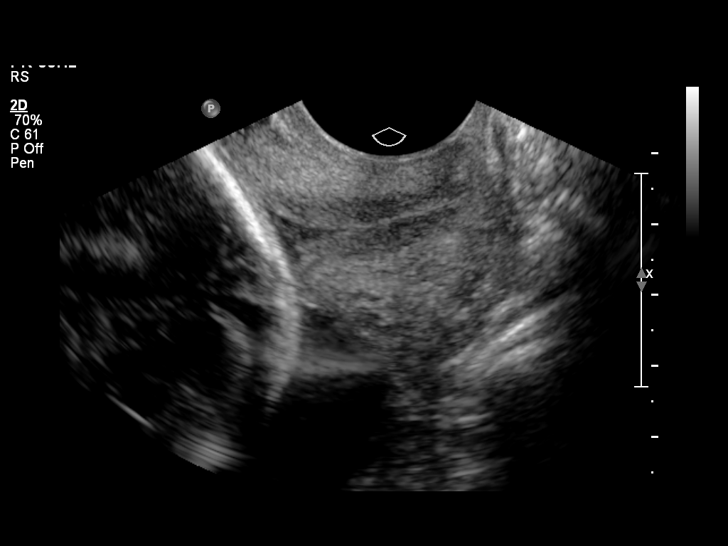
[im 92/96]
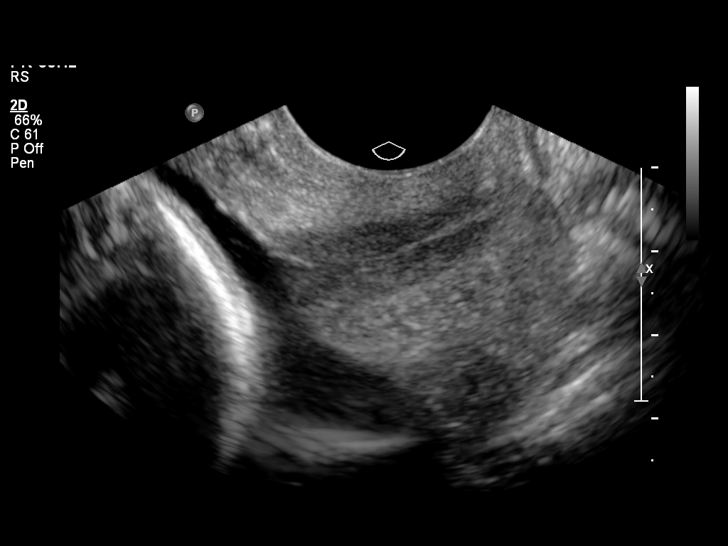

[12 of 28 positions shown; findings below may reference images not displayed]

OBSTETRICS REPORT
                      (Signed Final 03/05/2014 [DATE])

Service(s) Provided

 US OB FOLLOW UP                                       76816.1
 US MFM OB TRANSVAGINAL                                76817.2
Indications

 Evaluate anatomy not seen on prior sonogram           Z36
 24 weeks gestation of pregnancy
Fetal Evaluation

 Num Of Fetuses:    1
 Fetal Heart Rate:  144                          bpm
 Cardiac Activity:  Observed
 Presentation:      Cephalic
 Placenta:          Posterior, above cervical
                    os
 P. Cord            Previously Visualized
 Insertion:

 Amniotic Fluid
 AFI FV:      Subjectively within normal limits
                                             Larg Pckt:    3.41  cm
Biometry

 BPD:       55  mm     G. Age:  22w 6d                CI:         75.2   70 - 86
 OFD:     73.1  mm                                    FL/HC:      21.2   18.7 -

 HC:     211.7  mm     G. Age:  23w 2d        8  %    HC/AC:      1.12   1.05 -

 AC:     188.2  mm     G. Age:  23w 4d       25  %    FL/BPD:     81.6   71 - 87
 FL:      44.9  mm     G. Age:  24w 6d       59  %    FL/AC:      23.9   20 - 24
 HUM:     39.9  mm     G. Age:  24w 2d       46  %

 Est. FW:     648  gm      1 lb 7 oz     49  %
Gestational Age

 LMP:           24w 1d        Date:  09/17/13                 EDD:   06/24/14
 U/S Today:     23w 5d                                        EDD:   06/27/14
 Best:          24w 1d     Det. By:  LMP  (09/17/13)          EDD:   06/24/14
Anatomy
 Cranium:          Appears normal         Aortic Arch:      Appears normal
 Fetal Cavum:      Appears normal         Ductal Arch:      Appears normal
 Ventricles:       Appears normal         Diaphragm:        Appears normal
 Choroid Plexus:   Previously seen        Stomach:          Appears normal, left
                                                            sided
 Cerebellum:       Previously seen        Abdomen:          Previously seen
 Posterior Fossa:  Previously seen        Abdominal Wall:   Previously seen
 Nuchal Fold:      Previously seen        Cord Vessels:     Previously seen
 Face:             Orbits and profile     Kidneys:          Appear normal
                   previously seen
 Lips:             Previously seen        Bladder:          Appears normal
 Heart:            Appears normal         Spine:            Previously seen
                   (4CH, axis, and
                   situs)
 RVOT:             Appears normal         Lower             Previously seen
                                          Extremities:
 LVOT:             Appears normal         Upper             Previously seen
                                          Extremities:

 Other:  Fetus appears to be a male. Heels visualized previously.
Targeted Anatomy

 Fetal Central Nervous System
 Lat. Ventricles:
Cervix Uterus Adnexa

 Cervical Length:    2.59     cm

 Cervix:       Measured transvaginally.
 Uterus:       No abnormality visualized.
 Cul De Sac:   No free fluid seen.
 Left Ovary:    No adnexal mass visualized.
 Right Ovary:   No adnexal mass visualized.

 Adnexa:     No adnexal mass visualized.
Impression

 Single IUP at 24w 1d
 Normal interval anatomy.  The fetal anatomic survey is now
 complete.
 Fetal growth is appropriate (49th %tile)
 Posterior placenta without previa

 TVUS - cervical length 2.6 cm without funneling or dynamic
 changes
Recommendations

 Follow-up ultrasounds as clinically indicated.

 questions or concerns.

## 2015-11-03 MED ORDER — CYCLOBENZAPRINE HCL 10 MG PO TABS: 10.0000 mg | ORAL_TABLET | Freq: Two times a day (BID) | ORAL | 0 refills | Status: DC | PRN

## 2015-11-03 MED ORDER — CYCLOBENZAPRINE HCL 10 MG PO TABS
10.0000 mg | ORAL_TABLET | Freq: Once | ORAL | Status: AC
  Administered 2015-11-03: 10 mg via ORAL
  Filled 2015-11-03: qty 1

## 2015-11-03 NOTE — MAU Note (Signed)
Pt states she fell on 10/30/15 when she tripped over a block and fell on her right side.  [facility] and then was told to go to her doctor.  She said that if she was having pain and could not wait to see her doctor she needed to be checked out sooner.

## 2015-11-03 NOTE — Discharge Instructions (Signed)

## 2015-11-03 NOTE — MAU Provider Note (Signed)
History   G2P1001 @ 37.4 wks in with c/o persistent  Low back pain since falling last week. States pain is constant, sharp stabbing in nature.  CSN: [id number]  Arrival date & time 11/03/15  1044   None     Chief Complaint  Patient presents with  . Back Pain    HPI  Past Medical History:  Diagnosis Date  . Hx of chlamydia infection 05/04/14   treated, TOC-neg  . Hx of gonorrhea 05/04/14   treated, TOC-neg  . Hx of wisdom tooth extraction     Past Surgical History:  Procedure Laterality Date  . WISDOM TOOTH EXTRACTION      Family History  Problem Relation Age of Onset  . Adopted: Yes  . Cancer Mother     pseudotumor in brain    Social History  Substance Use Topics  . Smoking status: Never Smoker  . Smokeless tobacco: Never Used  . Alcohol use No    OB History    Gravida Para Term Preterm AB Living   2 1 1     1    SAB TAB Ectopic Multiple Live Births         0 1      Review of Systems  Constitutional: Negative.   HENT: Negative.   Eyes: Negative.   Respiratory: Negative.   Cardiovascular: Negative.   Gastrointestinal: Negative.   Endocrine: Negative.   Genitourinary: Negative.   Musculoskeletal: Positive for back pain.  Skin: Negative.   Allergic/Immunologic: Negative.   Neurological: Negative.   Hematological: Negative.   Psychiatric/Behavioral: Negative.     Allergies  Review of patient's allergies indicates no known allergies.  Home Medications    BP 95/74 (BP Location: Left Arm)   Pulse 96   Temp 97.5 F (36.4 C) (Oral)   Resp 18   LMP 02/15/2015 (Approximate)   Physical Exam  Constitutional: She is oriented to person, place, and time. She appears well-developed and well-nourished.  HENT:  Head: Normocephalic.  Eyes: Pupils are equal, round, and reactive to light.  Neck: Normal range of motion.  Cardiovascular: Normal rate, regular rhythm, normal heart sounds and intact distal pulses.   Pulmonary/Chest: Effort normal and breath  sounds normal.  Abdominal: Soft. Bowel sounds are normal.  Genitourinary: Vagina normal and uterus normal.  Musculoskeletal: Normal range of motion.  Neurological: She is alert and oriented to person, place, and time. She has normal reflexes.  Skin: Skin is warm and dry.  Psychiatric: She has a normal mood and affect. Her behavior is normal. Judgment and thought content normal.    MAU Course  Procedures (including critical care time)  Labs Reviewed - No data to display No results found.   No diagnosis found.    MDM  Low back pain, SVE 06-05-78--1, no uc's palpated and pt denies contractions. Will tx with flexiril and d/c home if improvement in condition.

## 2015-11-06 ENCOUNTER — Ambulatory Visit (INDEPENDENT_AMBULATORY_CARE_PROVIDER_SITE_OTHER): Payer: Medicaid Other | Admitting: Obstetrics & Gynecology

## 2015-11-06 VITALS — BP 106/60 | HR 89 | Wt 157.0 lb

## 2015-11-06 DIAGNOSIS — Z3483 Encounter for supervision of other normal pregnancy, third trimester: Secondary | ICD-10-CM

## 2015-11-06 DIAGNOSIS — O368130 Decreased fetal movements, third trimester, not applicable or unspecified: Secondary | ICD-10-CM | POA: Diagnosis present

## 2015-11-06 LAB — POCT URINALYSIS DIP (DEVICE)
Bilirubin Urine: NEGATIVE
Glucose, UA: NEGATIVE mg/dL
HGB URINE DIPSTICK: NEGATIVE
Ketones, ur: NEGATIVE mg/dL
NITRITE: NEGATIVE
PH: 7 (ref 5.0–8.0)
PROTEIN: NEGATIVE mg/dL
Specific Gravity, Urine: 1.015 (ref 1.005–1.030)
UROBILINOGEN UA: 1 mg/dL (ref 0.0–1.0)

## 2015-11-06 NOTE — Patient Instructions (Signed)
Fetal Movement Counts  Patient Name: __________________________________________________ Patient Due Date: ____________________  Performing a fetal movement count is highly recommended in high-risk pregnancies, but it is good for every pregnant woman to do. Your health care provider may ask you to start counting fetal movements at 28 weeks of the pregnancy. Fetal movements often increase:  · After eating a full meal.  · After physical activity.  · After eating or drinking something sweet or cold.  · At rest.  Pay attention to when you feel the baby is most active. This will help you notice a pattern of your baby's sleep and wake cycles and what factors contribute to an increase in fetal movement. It is important to perform a fetal movement count at the same time each day when your baby is normally most active.   HOW TO COUNT FETAL MOVEMENTS  1. Find a quiet and comfortable area to sit or lie down on your left side. Lying on your left side provides the best blood and oxygen circulation to your baby.  2. Write down the day and time on a sheet of paper or in a journal.  3. Start counting kicks, flutters, swishes, rolls, or jabs in a 2-hour period. You should feel at least 10 movements within 2 hours.  4. If you do not feel 10 movements in 2 hours, wait 2-3 hours and count again. Look for a change in the pattern or not enough counts in 2 hours.  SEEK MEDICAL CARE IF:  · You feel less than 10 counts in 2 hours, tried twice.  · There is no movement in over an hour.  · The pattern is changing or taking longer each day to reach 10 counts in 2 hours.  · You feel the baby is not moving as he or she usually does.  Date: ____________ Movements: ____________ Start time: ____________ Finish time: ____________   Date: ____________ Movements: ____________ Start time: ____________ Finish time: ____________  Date: ____________ Movements: ____________ Start time: ____________ Finish time: ____________  Date: ____________ Movements:  ____________ Start time: ____________ Finish time: ____________  Date: ____________ Movements: ____________ Start time: ____________ Finish time: ____________  Date: ____________ Movements: ____________ Start time: ____________ Finish time: ____________  Date: ____________ Movements: ____________ Start time: ____________ Finish time: ____________  Date: ____________ Movements: ____________ Start time: ____________ Finish time: ____________   Date: ____________ Movements: ____________ Start time: ____________ Finish time: ____________  Date: ____________ Movements: ____________ Start time: ____________ Finish time: ____________  Date: ____________ Movements: ____________ Start time: ____________ Finish time: ____________  Date: ____________ Movements: ____________ Start time: ____________ Finish time: ____________  Date: ____________ Movements: ____________ Start time: ____________ Finish time: ____________  Date: ____________ Movements: ____________ Start time: ____________ Finish time: ____________  Date: ____________ Movements: ____________ Start time: ____________ Finish time: ____________   Date: ____________ Movements: ____________ Start time: ____________ Finish time: ____________  Date: ____________ Movements: ____________ Start time: ____________ Finish time: ____________  Date: ____________ Movements: ____________ Start time: ____________ Finish time: ____________  Date: ____________ Movements: ____________ Start time: ____________ Finish time: ____________  Date: ____________ Movements: ____________ Start time: ____________ Finish time: ____________  Date: ____________ Movements: ____________ Start time: ____________ Finish time: ____________  Date: ____________ Movements: ____________ Start time: ____________ Finish time: ____________   Date: ____________ Movements: ____________ Start time: ____________ Finish time: ____________  Date: ____________ Movements: ____________ Start time: ____________ Finish  time: ____________  Date: ____________ Movements: ____________ Start time: ____________ Finish time: ____________  Date: ____________ Movements: ____________ Start time:   ____________ Finish time: ____________  Date: ____________ Movements: ____________ Start time: ____________ Finish time: ____________  Date: ____________ Movements: ____________ Start time: ____________ Finish time: ____________  Date: ____________ Movements: ____________ Start time: ____________ Finish time: ____________   Date: ____________ Movements: ____________ Start time: ____________ Finish time: ____________  Date: ____________ Movements: ____________ Start time: ____________ Finish time: ____________  Date: ____________ Movements: ____________ Start time: ____________ Finish time: ____________  Date: ____________ Movements: ____________ Start time: ____________ Finish time: ____________  Date: ____________ Movements: ____________ Start time: ____________ Finish time: ____________  Date: ____________ Movements: ____________ Start time: ____________ Finish time: ____________  Date: ____________ Movements: ____________ Start time: ____________ Finish time: ____________   Date: ____________ Movements: ____________ Start time: ____________ Finish time: ____________  Date: ____________ Movements: ____________ Start time: ____________ Finish time: ____________  Date: ____________ Movements: ____________ Start time: ____________ Finish time: ____________  Date: ____________ Movements: ____________ Start time: ____________ Finish time: ____________  Date: ____________ Movements: ____________ Start time: ____________ Finish time: ____________  Date: ____________ Movements: ____________ Start time: ____________ Finish time: ____________  Date: ____________ Movements: ____________ Start time: ____________ Finish time: ____________   Date: ____________ Movements: ____________ Start time: ____________ Finish time: ____________  Date: ____________  Movements: ____________ Start time: ____________ Finish time: ____________  Date: ____________ Movements: ____________ Start time: ____________ Finish time: ____________  Date: ____________ Movements: ____________ Start time: ____________ Finish time: ____________  Date: ____________ Movements: ____________ Start time: ____________ Finish time: ____________  Date: ____________ Movements: ____________ Start time: ____________ Finish time: ____________  Date: ____________ Movements: ____________ Start time: ____________ Finish time: ____________   Date: ____________ Movements: ____________ Start time: ____________ Finish time: ____________  Date: ____________ Movements: ____________ Start time: ____________ Finish time: ____________  Date: ____________ Movements: ____________ Start time: ____________ Finish time: ____________  Date: ____________ Movements: ____________ Start time: ____________ Finish time: ____________  Date: ____________ Movements: ____________ Start time: ____________ Finish time: ____________  Date: ____________ Movements: ____________ Start time: ____________ Finish time: ____________     This information is not intended to replace advice given to you by your health care provider. Make sure you discuss any questions you have with your health care provider.     Document Released: 03/18/2006 Document Revised: 03/09/2014 Document Reviewed: 12/14/2011  Elsevier Interactive Patient Education ©2016 [employer]

## 2015-11-06 NOTE — Progress Notes (Signed)
   PRENATAL VISIT NOTE  Subjective:  Amanda [name] is a 20 y.o. G2P1001 at 4123w0d being seen today for ongoing prenatal care.  She is currently monitored for the following issues for this low-risk pregnancy and has Supervision of normal pregnancy, antepartum; Short interval between pregnancies affecting pregnancy, antepartum; and Fall on her problem list.  Patient reports decreased fetal movement since this past weekend.  Contractions: Irregular. Vag. Bleeding: None.  Movement: (!) Decreased. Denies leaking of fluid.   The following portions of the patient's history were reviewed and updated as appropriate: allergies, current medications, past family history, past medical history, past social history, past surgical history and problem list. Problem list updated.  Objective:   Vitals:   11/06/15 1352  BP: 106/60  Pulse: 89  Weight: 157 lb (71.2 kg)    Fetal Status: Fetal Heart Rate (bpm): RNST Fundal Height: 37 cm Movement: (!) Decreased     General:  Alert, oriented and cooperative. Patient is in no acute distress.  Skin: Skin is warm and dry. No rash noted.   Cardiovascular: Normal heart rate noted  Respiratory: Normal respiratory effort, no problems with respiration noted  Abdomen: Soft, gravid, appropriate for gestational age. Pain/Pressure: Present     Pelvic:  Deferred      Extremities: Normal range of motion.  Edema: None  Mental Status: Normal mood and affect. Normal behavior. Normal judgment and thought content.   Urinalysis: Urine Protein: Negative Urine Glucose: Negative  Assessment and Plan:  Pregnancy: G2P1001 at 3923w0d  1. Decreased fetal movement during pregnancy in third trimester, antepartum, not applicable or unspecified fetus Reactive NST today. Patient instructed about kick counts, fetal movements precautions advised.  2. Supervision of normal pregnancy, antepartum, third trimester Term labor symptoms and general obstetric precautions including but not limited  to vaginal bleeding, contractions, leaking of fluid and fetal movement were reviewed in detail with the patient. Please refer to After Visit Summary for other counseling recommendations.  Return in about 1 week (around 11/13/2015) for OB Visit.   Tereso NewcomerUgonna A Kaylem Gidney, MD

## 2015-11-06 NOTE — Progress Notes (Signed)
Patient reports decreased FM since taking flexeril on Sunday. Patient states she hasn't taken additional medication since then but feels like the baby is only moving 2 times per day

## 2015-11-10 ENCOUNTER — Inpatient Hospital Stay (HOSPITAL_COMMUNITY)
Admission: AD | Admit: 2015-11-10 | Discharge: 2015-11-12 | DRG: 775 | Disposition: A | Discharge status: Home / Self Care | Payer: Medicaid Other | Source: Ambulatory Visit | Attending: Obstetrics and Gynecology | Admitting: Obstetrics and Gynecology

## 2015-11-10 ENCOUNTER — Inpatient Hospital Stay (HOSPITAL_COMMUNITY): Payer: Medicaid Other | Admitting: Anesthesiology

## 2015-11-10 ENCOUNTER — Encounter (HOSPITAL_COMMUNITY): Payer: Self-pay | Admitting: *Deleted

## 2015-11-10 DIAGNOSIS — O4202 Full-term premature rupture of membranes, onset of labor within 24 hours of rupture: Secondary | ICD-10-CM | POA: Diagnosis present

## 2015-11-10 DIAGNOSIS — Z3A38 38 weeks gestation of pregnancy: Secondary | ICD-10-CM | POA: Diagnosis not present

## 2015-11-10 DIAGNOSIS — O99824 Streptococcus B carrier state complicating childbirth: Secondary | ICD-10-CM | POA: Diagnosis present

## 2015-11-10 DIAGNOSIS — IMO0001 Reserved for inherently not codable concepts without codable children: Secondary | ICD-10-CM

## 2015-11-10 LAB — CBC
HEMATOCRIT: 32 % — AB (ref 36.0–46.0)
HEMOGLOBIN: 10.9 g/dL — AB (ref 12.0–15.0)
MCH: 28.4 pg (ref 26.0–34.0)
MCHC: 34.1 g/dL (ref 30.0–36.0)
MCV: 83.3 fL (ref 78.0–100.0)
Platelets: 141 10*3/uL — ABNORMAL LOW (ref 150–400)
RBC: 3.84 MIL/uL — ABNORMAL LOW (ref 3.87–5.11)
RDW: 14.6 % (ref 11.5–15.5)
WBC: 7.9 10*3/uL (ref 4.0–10.5)

## 2015-11-10 LAB — RAPID HIV SCREEN (HIV 1/2 AB+AG)
HIV 1/2 ANTIBODIES: NONREACTIVE
HIV-1 P24 Antigen - HIV24: NONREACTIVE

## 2015-11-10 LAB — TYPE AND SCREEN
ABO/RH(D): B POS
ANTIBODY SCREEN: NEGATIVE

## 2015-11-10 LAB — POCT FERN TEST: POCT Fern Test: POSITIVE

## 2015-11-10 MED ORDER — PENICILLIN G POTASSIUM 5000000 UNITS IJ SOLR
2.5000 10*6.[IU] | INTRAVENOUS | Status: DC
  Filled 2015-11-10: qty 2.5

## 2015-11-10 MED ORDER — OXYTOCIN 40 UNITS IN LACTATED RINGERS INFUSION - SIMPLE MED
2.5000 [IU]/h | INTRAVENOUS | Status: DC
  Filled 2015-11-10: qty 1000

## 2015-11-10 MED ORDER — LIDOCAINE HCL (PF) 1 % IJ SOLN
30.0000 mL | INTRAMUSCULAR | Status: DC | PRN
  Filled 2015-11-10: qty 30

## 2015-11-10 MED ORDER — IBUPROFEN 600 MG PO TABS
600.0000 mg | ORAL_TABLET | Freq: Four times a day (QID) | ORAL | Status: DC
  Administered 2015-11-11 – 2015-11-12 (×6): 600 mg via ORAL
  Filled 2015-11-10 (×7): qty 1

## 2015-11-10 MED ORDER — FLEET ENEMA 7-19 GM/118ML RE ENEM: 1.0000 | ENEMA | RECTAL | Status: DC | PRN

## 2015-11-10 MED ORDER — DIPHENHYDRAMINE HCL 50 MG/ML IJ SOLN: 12.5000 mg | INTRAMUSCULAR | Status: DC | PRN

## 2015-11-10 MED ORDER — LIDOCAINE HCL (PF) 1 % IJ SOLN
INTRAMUSCULAR | Status: DC | PRN
  Administered 2015-11-10 (×2): 4 mL

## 2015-11-10 MED ORDER — EPHEDRINE 5 MG/ML INJ
10.0000 mg | INTRAVENOUS | Status: DC | PRN
  Filled 2015-11-10: qty 4

## 2015-11-10 MED ORDER — FENTANYL 2.5 MCG/ML BUPIVACAINE 1/10 % EPIDURAL INFUSION (WH - ANES)
14.0000 mL/h | INTRAMUSCULAR | Status: DC | PRN
  Administered 2015-11-10: 14 mL/h via EPIDURAL
  Filled 2015-11-10: qty 125

## 2015-11-10 MED ORDER — SOD CITRATE-CITRIC ACID 500-334 MG/5ML PO SOLN: 30.0000 mL | ORAL | Status: DC | PRN

## 2015-11-10 MED ORDER — OXYCODONE-ACETAMINOPHEN 5-325 MG PO TABS: 1.0000 | ORAL_TABLET | ORAL | Status: DC | PRN

## 2015-11-10 MED ORDER — LACTATED RINGERS IV SOLN: INTRAVENOUS | Status: DC

## 2015-11-10 MED ORDER — PHENYLEPHRINE 40 MCG/ML (10ML) SYRINGE FOR IV PUSH (FOR BLOOD PRESSURE SUPPORT)
80.0000 ug | PREFILLED_SYRINGE | INTRAVENOUS | Status: DC | PRN
  Filled 2015-11-10: qty 5
  Filled 2015-11-10: qty 10

## 2015-11-10 MED ORDER — ACETAMINOPHEN 325 MG PO TABS: 650.0000 mg | ORAL_TABLET | ORAL | Status: DC | PRN

## 2015-11-10 MED ORDER — OXYCODONE-ACETAMINOPHEN 5-325 MG PO TABS: 2.0000 | ORAL_TABLET | ORAL | Status: DC | PRN

## 2015-11-10 MED ORDER — SODIUM CHLORIDE 0.9 % IV SOLN
2.0000 g | Freq: Once | INTRAVENOUS | Status: AC
  Administered 2015-11-10: 2 g via INTRAVENOUS
  Filled 2015-11-10: qty 2000

## 2015-11-10 MED ORDER — FENTANYL 2.5 MCG/ML BUPIVACAINE 1/10 % EPIDURAL INFUSION (WH - ANES): 14.0000 mL/h | INTRAMUSCULAR | Status: DC | PRN

## 2015-11-10 MED ORDER — PENICILLIN G POTASSIUM 5000000 UNITS IJ SOLR
5.0000 10*6.[IU] | Freq: Once | INTRAVENOUS | Status: DC
  Filled 2015-11-10: qty 5

## 2015-11-10 MED ORDER — ONDANSETRON HCL 4 MG/2ML IJ SOLN
4.0000 mg | Freq: Four times a day (QID) | INTRAMUSCULAR | Status: DC | PRN
  Filled 2015-11-10: qty 2

## 2015-11-10 MED ORDER — PHENYLEPHRINE 40 MCG/ML (10ML) SYRINGE FOR IV PUSH (FOR BLOOD PRESSURE SUPPORT)
80.0000 ug | PREFILLED_SYRINGE | INTRAVENOUS | Status: DC | PRN
  Filled 2015-11-10: qty 5

## 2015-11-10 MED ORDER — LACTATED RINGERS IV SOLN
500.0000 mL | Freq: Once | INTRAVENOUS | Status: AC
  Administered 2015-11-10: 500 mL via INTRAVENOUS

## 2015-11-10 MED ORDER — LACTATED RINGERS IV SOLN
500.0000 mL | INTRAVENOUS | Status: DC | PRN
  Administered 2015-11-10: 1000 mL via INTRAVENOUS

## 2015-11-10 MED ORDER — OXYTOCIN BOLUS FROM INFUSION
500.0000 mL | Freq: Once | INTRAVENOUS | Status: AC
  Administered 2015-11-10: 500 mL via INTRAVENOUS

## 2015-11-10 NOTE — MAU Note (Signed)
Pt presents complaining of SROM at 2000. States it was copious amounts of clear fluid. Having some contractions. Denies bleeding. Reports good fetal movement

## 2015-11-10 NOTE — Progress Notes (Signed)
Notified of pt arrival in MAU, SROM with positive fern, FHR tracing with variables, and pt desire for epidural. Will admit to labor and delivery.

## 2015-11-10 NOTE — MAU Note (Signed)
Pt reports some contractions today and then a "pop" at about 2000.

## 2015-11-10 NOTE — H&P (Signed)
LABOR AND DELIVERY ADMISSION HISTORY AND PHYSICAL NOTE  Amanda [name] is a 20 y.o. female G2P1001 with IUP at 3477w4d by LMP presenting for SROM around 8pm today. Feeling regular contractions. She reports positive fetal movement. She is GBS positive.  Prenatal History/Complications: Clinic  [facility]  Dating  LMP confirmed by early sono Blood type:   B+  Genetic Screen  Declined Antibody: neg  Anatomic US Normal per patient Rubella:    GTT  Third trimester: 97 RPR:     Flu vaccine   HBsAg:     TDaP vaccine    09/24/15                                    HIV:     Baby Food Formula                                         GBS: (For PCN allergy, check sensitivities)  Contraception  Depo Provera Pap:  Circumcision N/a (female)   Pediatric[facility]   Support Person Mother    - Short Interval pregnancy  Past Medical History: Past Medical History:  Diagnosis Date  . Hx of chlamydia infection 05/04/14   treated, TOC-neg  . Hx of gonorrhea 05/04/14   treated, TOC-neg  . Hx of wisdom tooth extraction     Past Surgical History: Past Surgical History:  Procedure Laterality Date  . WISDOM TOOTH EXTRACTION      Obstetrical History: OB History    Gravida Para Term Preterm AB Living   2 1 1     1    SAB TAB Ectopic Multiple Live Births         0 1      Social History: Social History   Social History  . Marital status: Single    Spouse name: N/A  . Number of children: N/A  . Years of education: N/A   Social History Main Topics  . Smoking status: Never Smoker  . Smokeless tobacco: Never Used  . Alcohol use No  . Drug use: No  . Sexual activity: Not Currently    Birth control/ protection: None   Other Topics Concern  . None   Social History Narrative  . None    Family History: Family History  Problem Relation Age of Onset  . Adopted: Yes  . Cancer Mother     pseudotumor in brain    Allergies: No Known  Allergies  Prescriptions Prior to Admission  Medication Sig Dispense Refill Last Dose  . acetaminophen (TYLENOL) 500 MG tablet Take 1,000 mg by mouth every 6 (six) hours as needed for moderate pain.   Past Week at Unknown time  . Prenatal Vit-Fe Fumarate-FA (PRENATAL MULTIVITAMIN) TABS tablet Take 1 tablet by mouth daily at 12 noon.    Past Week at Unknown time     Review of Systems  All systems reviewed and negative except as stated in HPI  Blood pressure 125/89, pulse 92, temperature 98.4 F (36.9 C), resp. rate 18, last menstrual period 02/15/2015, not currently breastfeeding. General appearance: alert, in some distress with contractions Lungs: clear to auscultation bilaterally Heart: regular rate and rhythm Abdomen: soft, non-tender; bowel sounds normal Extremities: No calf swelling or tenderness Presentation: cephalic Fetal monitoring: baseline rate 135, moderate  variability, +acel, occasional variable Uterine activity: irregular contractions    Prenatal labs: ABO, Rh: --/--/B POS (08/30 2103) Antibody: NEG (08/30 2103) Rubella:  RPR:   NR HBsAg:    HIV:   n/a GBS:   Positive 1 hr Glucola: 97 Genetic screening:  declined Anatomy US: normal  Prenatal Transfer Tool  Maternal Diabetes: No Genetic Screening: Declined Maternal Ultrasounds/Referrals: Normal Fetal Ultrasounds or other Referrals:  None Maternal Substance Abuse:  No Significant Maternal Medications:  None Significant Maternal Lab Results: None  Results for orders placed or performed during the hospital encounter of 11/10/15 (from the past 24 hour(s))  Fern Test   Collection Time: 11/10/15  8:48 PM  Result Value Ref Range   POCT Fern Test Positive = ruptured amniotic membanes     Patient Active Problem List   Diagnosis Date Noted  . Fall 10/30/2015  . Supervision of normal pregnancy, antepartum 09/06/2015  . Short interval between pregnancies affecting pregnancy, antepartum 09/06/2015     Assessment: Amanda [name] is a 20 y.o. G2P1001 at [redacted]w[redacted]d here for SROM/SOL.  #Labor: Expectant management #Pain: Pt may have epdirual #FWB:  Category II with occasional variable #ID: GBS +, PNC ordered #MOF: bottle #MOC: Depo #Circ:  N/A (female)  Kandra Nicolas Degele 11/10/2015, 8:57 PM

## 2015-11-10 NOTE — Anesthesia Preprocedure Evaluation (Signed)
Anesthesia Evaluation  Patient identified by MRN, date of birth, ID band Patient awake    Reviewed: Allergy & Precautions, NPO status , Patient's Chart, lab work & pertinent test results  History of Anesthesia Complications Negative for: history of anesthetic complications  Airway Mallampati: II  TM Distance: >3 FB Neck ROM: Full    Dental  (+) Teeth Intact   Pulmonary neg pulmonary ROS,  breath sounds clear to auscultation        Cardiovascular negative cardio ROS  Rhythm:Regular     Neuro/Psych negative neurological ROS  negative psych ROS   GI/Hepatic negative GI ROS, Neg liver ROS,   Endo/Other  negative endocrine ROS  Renal/GU negative Renal ROS     Musculoskeletal   Abdominal   Peds  Hematology  (+) anemia ,   Anesthesia Other Findings   Reproductive/Obstetrics (+) Pregnancy                             Anesthesia Physical Anesthesia Plan  ASA: II  Anesthesia Plan: Epidural   Post-op Pain Management:    Induction:   Airway Management Planned:   Additional Equipment:   Intra-op Plan:   Post-operative Plan:   Informed Consent: I have reviewed the patients History and Physical, chart, labs and discussed the procedure including the risks, benefits and alternatives for the proposed anesthesia with the patient or authorized representative who has indicated his/her understanding and acceptance.     Plan Discussed with: Anesthesiologist  Anesthesia Plan Comments:         Anesthesia Quick Evaluation  

## 2015-11-11 LAB — HEPATITIS B SURFACE ANTIGEN: Hepatitis B Surface Ag: NEGATIVE

## 2015-11-11 LAB — RPR: RPR: NONREACTIVE

## 2015-11-11 MED ORDER — DIPHENHYDRAMINE HCL 25 MG PO CAPS
25.0000 mg | ORAL_CAPSULE | Freq: Four times a day (QID) | ORAL | Status: DC | PRN
Start: 2015-11-11 — End: 2015-11-12

## 2015-11-11 MED ORDER — ZOLPIDEM TARTRATE 5 MG PO TABS: 5.0000 mg | ORAL_TABLET | Freq: Every evening | ORAL | Status: DC | PRN

## 2015-11-11 MED ORDER — COCONUT OIL OIL: 1.0000 "application " | TOPICAL_OIL | Status: DC | PRN

## 2015-11-11 MED ORDER — ONDANSETRON HCL 4 MG PO TABS: 4.0000 mg | ORAL_TABLET | ORAL | Status: DC | PRN

## 2015-11-11 MED ORDER — WITCH HAZEL-GLYCERIN EX PADS: 1.0000 "application " | MEDICATED_PAD | CUTANEOUS | Status: DC | PRN

## 2015-11-11 MED ORDER — ACETAMINOPHEN 325 MG PO TABS
650.0000 mg | ORAL_TABLET | ORAL | Status: DC | PRN
  Administered 2015-11-11 – 2015-11-12 (×3): 650 mg via ORAL
  Filled 2015-11-11 (×3): qty 2

## 2015-11-11 MED ORDER — ONDANSETRON HCL 4 MG/2ML IJ SOLN: 4.0000 mg | INTRAMUSCULAR | Status: DC | PRN

## 2015-11-11 MED ORDER — DIBUCAINE 1 % RE OINT: 1.0000 "application " | TOPICAL_OINTMENT | RECTAL | Status: DC | PRN

## 2015-11-11 MED ORDER — TETANUS-DIPHTH-ACELL PERTUSSIS 5-2.5-18.5 LF-MCG/0.5 IM SUSP: 0.5000 mL | Freq: Once | INTRAMUSCULAR | Status: DC

## 2015-11-11 MED ORDER — SENNOSIDES-DOCUSATE SODIUM 8.6-50 MG PO TABS
2.0000 | ORAL_TABLET | ORAL | Status: DC
  Administered 2015-11-11: 2 via ORAL
  Filled 2015-11-11: qty 2

## 2015-11-11 MED ORDER — SIMETHICONE 80 MG PO CHEW: 80.0000 mg | CHEWABLE_TABLET | ORAL | Status: DC | PRN

## 2015-11-11 MED ORDER — BENZOCAINE-MENTHOL 20-0.5 % EX AERO: 1.0000 "application " | INHALATION_SPRAY | CUTANEOUS | Status: DC | PRN

## 2015-11-11 MED ORDER — PRENATAL MULTIVITAMIN CH
1.0000 | ORAL_TABLET | Freq: Every day | ORAL | Status: DC
  Administered 2015-11-11 – 2015-11-12 (×2): 1 via ORAL
  Filled 2015-11-11 (×2): qty 1

## 2015-11-11 NOTE — Progress Notes (Signed)
LCSW received consult for Late Prenatal Care at 29 weeks. Chart was reviewed and it appears she was active in care with Dr. [facility] at [facility].  There is a release of information in the chart in which she signed to have all paperwork faxed over, however this was not completed per her report.  MOB reports she did actively go to appointments throughout prenatal.  She transferred after he retired to clinic at [facility].  She denies any other needs at this time. Will sign off as MOB was active in care.  Nishita Isaacks LCSW, MSW Clinical Social Work: System Wide Float Coverage for [facility] NICU Clinical social worker [phone] 

## 2015-11-11 NOTE — Progress Notes (Signed)
Post Partum Day 1 Subjective: no complaints, up ad lib, voiding, tolerating PO and + flatus  Objective: Blood pressure (!) 100/58, pulse (!) 54, temperature 98 F (36.7 C), temperature source Oral, resp. rate 18, height 5\' 2"  (1.575 m), weight 157 lb (71.2 kg), last menstrual period 02/15/2015, SpO2 98 %, unknown if currently breastfeeding.  Physical Exam:  General: alert, cooperative, appears stated age and no distress Lochia: appropriate Uterine Fundus: firm Incision: n/a DVT Evaluation: No evidence of DVT seen on physical exam. Negative Homan's sign.   Recent Labs  11/10/15 2055  HGB 10.9*  HCT 32.0*    Assessment/Plan: Plan for discharge tomorrow   LOS: 1 day   Amanda [name] 11/11/2015, 7:40 AM

## 2015-11-11 NOTE — Progress Notes (Addendum)
Post Partum Day 1  Subjective: Amanda [name] is a 20yo G2P2 s/p SVD after SOL last night. She had SROM at 8pm last night and delivered at 10:14pm.  Today pt is doing well. no complaints, up ad lib, voiding, tolerating PO and + flatus.  She has not had a BM yet.  She is feeling some cramping pain in her abdomen that she currently rates at 5/10 intensity.  She has taken Motrin for the pain with mild relief.  She reports minimal vaginal bleeding at this time.  She is bottle feeding and plans to continue with this. Wants Depo injection for contraception [facility]   Objective: Blood pressure (!) 100/58, pulse (!) 54, temperature 98 F (36.7 C), temperature source Oral, resp. rate 18, height 5\' 2"  (1.575 m), weight 71.2 kg (157 lb), last menstrual period 02/15/2015, SpO2 98 %, unknown if currently breastfeeding.  Physical Exam:  General: alert, cooperative and no distress Lochia: appropriate Uterine Fundus: firm DVT Evaluation: No evidence of DVT seen on physical exam.  Recent Labs  11/10/15 2055  HGB 10.9*  HCT 32.0*    Assessment/Plan: Contraception Depo  Continue to monitor and plan for discharge tomorrow. Motrin 600mg  Q6H prn for pain control   LOS: 1 day   Richard Godine 11/11/2015, 7:33 AM   RESIDENT ATTESTATION OF STUDENT NOTE  Note this is a medical student note and as such does not necessarily reflect the patient's plan of care.   Leland HerElsia J Yoo, DO PGY-1 11/13/2015, 2:53 PM

## 2015-11-11 NOTE — Anesthesia Postprocedure Evaluation (Signed)
Anesthesia Post Note  Patient: Amanda [name]  Procedure(s) Performed: * No procedures listed *  Patient location during evaluation: Mother Baby Anesthesia Type: Epidural Level of consciousness: awake and alert Pain management: satisfactory to patient Vital Signs Assessment: post-procedure vital signs reviewed and stable Respiratory status: respiratory function stable Cardiovascular status: stable Postop Assessment: no headache, no backache, epidural receding, patient able to bend at knees, no signs of nausea or vomiting and adequate PO intake Anesthetic complications: no     Last Vitals:  Vitals:   11/11/15 0230 11/11/15 0646  BP: (!) 105/55 (!) 100/58  Pulse: 69 (!) 54  Resp: 18 18  Temp: 36.4 C 36.7 C    Last Pain:  Vitals:   11/11/15 0646  TempSrc: Oral  PainSc:    Pain Goal: Patients Stated Pain Goal: 3 (11/11/15 0024)               Karleen DolphinFUSSELL,Lebaron Bautch

## 2015-11-12 LAB — RUBELLA SCREEN: Rubella: 2.45 index (ref >0.99)

## 2015-11-12 MED ORDER — SENNOSIDES-DOCUSATE SODIUM 8.6-50 MG PO TABS: 2.0000 | ORAL_TABLET | ORAL | 0 refills | Status: DC

## 2015-11-12 MED ORDER — IBUPROFEN 600 MG PO TABS: 600.0000 mg | ORAL_TABLET | Freq: Four times a day (QID) | ORAL | 0 refills | Status: DC

## 2015-11-12 NOTE — Discharge Summary (Signed)
OB Discharge Summary     Patient Name: Amanda [name] DOB: 1995-12-02 MRN: [id number]  Date of admission: 11/10/2015 Delivering MD: Frederik Pear   Date of discharge: 11/12/2015  Admitting diagnosis: 38 WKS, WATER BROKE Intrauterine pregnancy: [redacted]w[redacted]d     Secondary diagnosis:  Active Problems:   Active labor at term  Additional problems: short interval pregnancy     Discharge diagnosis: Term Pregnancy Delivered                                                                                                Post partum procedures:none  Augmentation: none  Complications: precipitous labor (<3hours)  Hospital course:  Onset of Labor With Vaginal Delivery     20 y.o. yo [id number] at [redacted]w[redacted]d was admitted in Active Labor on 11/10/2015. Patient had an uncomplicated labor course as follows:  Membrane Rupture Time/Date: 8:00 PM ,11/10/2015   Intrapartum Procedures: Episiotomy: None [1]                                         Lacerations:  Periurethral [8]  Patient had a delivery of a Viable infant. 11/10/2015  Information for the patient's newborn:  Srinika, Delone [[id number]]  Delivery Method: Vaginal, Spontaneous Delivery (Filed from Delivery Summary)    Pateint had an uncomplicated postpartum course.  She is ambulating, tolerating a regular diet, passing flatus, and urinating well. Patient is discharged home in stable condition on 11/12/15.    Physical exam Vitals:   11/11/15 0230 11/11/15 0646 11/11/15 1815 11/12/15 0558  BP: (!) 105/55 (!) 100/58 103/60 105/61  Pulse: 69 (!) 54 66 79  Resp: 18 18 18 19   Temp: 97.6 F (36.4 C) 98 F (36.7 C) 97.6 F (36.4 C) 98 F (36.7 C)  TempSrc: Oral Oral Oral Oral  SpO2:      Weight:      Height:       General: alert, cooperative and no distress Lochia: appropriate Uterine Fundus: firm DVT Evaluation: No evidence of DVT seen on physical exam. Negative Homan's sign. Labs: Lab Results  Component Value Date   WBC 7.9 11/10/2015    HGB 10.9 (L) 11/10/2015   HCT 32.0 (L) 11/10/2015   MCV 83.3 11/10/2015   PLT 141 (L) 11/10/2015   CMP Latest Ref Rng & Units 09/19/2015  Glucose 65 - 99 mg/dL 97  BUN 6 - 20 mg/dL 8  Creatinine 1.30 - 8.65 mg/dL 7.84  Sodium 696 - 295 mmol/L 134(L)  Potassium 3.5 - 5.1 mmol/L 3.6  Chloride 101 - 111 mmol/L 103  CO2 22 - 32 mmol/L 22  Calcium 8.9 - 10.3 mg/dL 2.8(U)  Total Protein 6.5 - 8.1 g/dL 7.7  Total Bilirubin 0.3 - 1.2 mg/dL 0.6  Alkaline Phos 38 - 126 U/L 78  AST 15 - 41 U/L 18  ALT 14 - 54 U/L 12(L)    Discharge instruction: per After Visit Summary and "Baby and Me Booklet".  After visit meds:    Medication  List    TAKE these medications   acetaminophen 500 MG tablet Commonly known as:  TYLENOL Take 1,000 mg by mouth every 6 (six) hours as needed for mild pain, moderate pain or headache.   calcium carbonate 500 MG chewable tablet Commonly known as:  TUMS - dosed in mg elemental calcium Chew 2 tablets by mouth as needed for indigestion or heartburn.   ibuprofen 600 MG tablet Commonly known as:  ADVIL,MOTRIN Take 1 tablet (600 mg total) by mouth every 6 (six) hours.   prenatal multivitamin Tabs tablet Take 1 tablet by mouth daily.   senna-docusate 8.6-50 MG tablet Commonly known as:  Senokot-S Take 2 tablets by mouth daily.       Diet: routine diet  Activity: Advance as tolerated. Pelvic rest for 6 weeks.   Outpatient follow up:6 weeks Follow up Appt:No future appointments. Follow up Visit: Follow-up Information    [facility]   Specialty:  Obstetrics and Gynecology Why:  postpartum visit Contact information: [address] [place] [id number] [phone]         Postpartum contraception: Depo Provera  Newborn Data: Live born female  Birth Weight: 6 lb 1.5 oz (2764 g) APGAR: 9, 9  Baby Feeding: Bottle Disposition:home with mother   11/12/2015 Leland HerElsia J Yoo, DO  PGY-1    OB FELLOW DISCHARGE ATTESTATION  I have seen and examined this patient and agree with above documentation in the resident's note.   Jen MowElizabeth Mumaw, DO OB Fellow 3:35 PM

## 2015-11-12 NOTE — Discharge Instructions (Signed)

## 2015-11-13 ENCOUNTER — Encounter: Payer: Medicaid Other | Admitting: Obstetrics & Gynecology

## 2015-11-13 ENCOUNTER — Ambulatory Visit (INDEPENDENT_AMBULATORY_CARE_PROVIDER_SITE_OTHER): Payer: Medicaid Other | Admitting: *Deleted

## 2015-11-13 DIAGNOSIS — Z3042 Encounter for surveillance of injectable contraceptive: Secondary | ICD-10-CM | POA: Diagnosis present

## 2015-11-13 MED ORDER — MEDROXYPROGESTERONE ACETATE 150 MG/ML IM SUSP
150.0000 mg | Freq: Once | INTRAMUSCULAR | Status: AC
  Administered 2015-11-13: 150 mg via INTRAMUSCULAR

## 2015-11-13 NOTE — Progress Notes (Signed)
Pt delivered baby on 9/10. She denies having sexual intercourse since delivery. Depo Provera given as prescribed. Pt advised of possible side effects of abnormal bleeding for 3-6 months while her body becomes accustomed to the hormone. She voiced understanding. Next injection due 11/29-12/13.  PP appt scheduled on 12/11/15.

## 2015-11-24 NOTE — H&P (Signed)
LABOR AND DELIVERY ADMISSION HISTORY AND PHYSICAL NOTE  Amanda [name] is a 20 y.o. female G2P1001 with IUP at [redacted]w[redacted]d by LMP presenting for SROM around 8pm today. Feeling regular contractions. She reports positive fetal movement. She is GBS positive.  Prenatal History/Complications: Clinic  [facility]-transfer from [facility] Prenatal Labs  Dating  LMP confirmed by early sono Blood type:   B+  Genetic Screen  Declined Antibody: neg  Anatomic US Normal per patient Rubella:    GTT  Third trimester: 97 RPR:     Flu vaccine   HBsAg:     TDaP vaccine    09/24/15                                    HIV:     Baby Food Formula                                         GBS: (For PCN allergy, check sensitivities)  Contraception  Depo Provera Pap:  Circumcision N/a (female)   Pediatrician [facility], [place]   Support Person Mother    - Short Interval pregnancy  Past Medical History: Past Medical History:  Diagnosis Date  . Hx of chlamydia infection 05/04/14   treated, TOC-neg  . Hx of gonorrhea 05/04/14   treated, TOC-neg  . Hx of wisdom tooth extraction     Past Surgical History: Past Surgical History:  Procedure Laterality Date  . WISDOM TOOTH EXTRACTION      Obstetrical History: OB History    Gravida Para Term Preterm AB Living   2 1 1     1   SAB TAB Ectopic Multiple Live Births         0 1      Social History: Social History   Social History  . Marital status: Single    Spouse name: N/A  . Number of children: N/A  . Years of education: N/A   Social History Main Topics  . Smoking status: Never Smoker  . Smokeless tobacco: Never Used  . Alcohol use No  . Drug use: No  . Sexual activity: Not Currently    Birth control/ protection: None   Other Topics Concern  . None   Social History Narrative  . None    Family History: Family History  Problem Relation Age of Onset  . Adopted: Yes  . Cancer Mother     pseudotumor in brain    Allergies: No Known  Allergies  Prescriptions Prior to Admission  Medication Sig Dispense Refill Last Dose  . acetaminophen (TYLENOL) 500 MG tablet Take 1,000 mg by mouth every 6 (six) hours as needed for moderate pain.   Past Week at Unknown time  . Prenatal Vit-Fe Fumarate-FA (PRENATAL MULTIVITAMIN) TABS tablet Take 1 tablet by mouth daily at 12 noon.    Past Week at Unknown time     Review of Systems  All systems reviewed and negative except as stated in HPI  Blood pressure 125/89, pulse 92, temperature 98.4 F (36.9 C), resp. rate 18, last menstrual period 02/15/2015, not currently breastfeeding. General appearance: alert, in some distress with contractions Lungs: clear to auscultation bilaterally Heart: regular rate and rhythm Abdomen: soft, non-tender; bowel sounds normal Extremities: No calf swelling or tenderness Presentation: cephalic Fetal monitoring: baseline rate 135, moderate   variability, +acel, occasional variable Uterine activity: irregular contractions    Prenatal labs: ABO, Rh: --/--/B POS (08/30 2103) Antibody: NEG (08/30 2103) Rubella:  RPR:   NR HBsAg:    HIV:   n/a GBS:   Positive 1 hr Glucola: 97 Genetic screening:  declined Anatomy US: normal  Prenatal Transfer Tool  Maternal Diabetes: No Genetic Screening: Declined Maternal Ultrasounds/Referrals: Normal Fetal Ultrasounds or other Referrals:  None Maternal Substance Abuse:  No Significant Maternal Medications:  None Significant Maternal Lab Results: None  Results for orders placed or performed during the hospital encounter of 11/10/15 (from the past 24 hour(s))  Fern Test   Collection Time: 11/10/15  8:48 PM  Result Value Ref Range   POCT Fern Test Positive = ruptured amniotic membanes     Patient Active Problem List   Diagnosis Date Noted  . Fall 10/30/2015  . Supervision of normal pregnancy, antepartum 09/06/2015  . Short interval between pregnancies affecting pregnancy, antepartum 09/06/2015     Assessment: Amanda [name] is a 20 y.o. G2P1001 at [redacted]w[redacted]d here for SROM/SOL.  #Labor: Expectant management #Pain: Pt may have epdirual #FWB:  Category II with occasional variable #ID: GBS +, PNC ordered #MOF: bottle #MOC: Depo #Circ:  N/A (female)  Julie P Degele 11/10/2015, 8:57 PM   

## 2015-11-25 ENCOUNTER — Inpatient Hospital Stay (HOSPITAL_COMMUNITY): Payer: Medicaid Other

## 2015-11-25 ENCOUNTER — Telehealth: Payer: Self-pay | Admitting: Family Medicine

## 2015-11-25 ENCOUNTER — Encounter (HOSPITAL_COMMUNITY): Payer: Self-pay | Admitting: *Deleted

## 2015-11-25 ENCOUNTER — Inpatient Hospital Stay (HOSPITAL_COMMUNITY)
Admission: AD | Admit: 2015-11-25 | Discharge: 2015-11-25 | Disposition: A | Discharge status: Home / Self Care | Payer: Medicaid Other | Source: Ambulatory Visit | Attending: Family Medicine | Admitting: Family Medicine

## 2015-11-25 DIAGNOSIS — N9489 Other specified conditions associated with female genital organs and menstrual cycle: Secondary | ICD-10-CM

## 2015-11-25 DIAGNOSIS — R109 Unspecified abdominal pain: Secondary | ICD-10-CM | POA: Diagnosis present

## 2015-11-25 DIAGNOSIS — O99893 Other specified diseases and conditions complicating puerperium: Secondary | ICD-10-CM

## 2015-11-25 DIAGNOSIS — O9089 Other complications of the puerperium, not elsewhere classified: Secondary | ICD-10-CM | POA: Insufficient documentation

## 2015-11-25 DIAGNOSIS — G8918 Other acute postprocedural pain: Secondary | ICD-10-CM

## 2015-11-25 LAB — URINALYSIS, ROUTINE W REFLEX MICROSCOPIC
Bilirubin Urine: NEGATIVE
GLUCOSE, UA: NEGATIVE mg/dL
Ketones, ur: NEGATIVE mg/dL
Leukocytes, UA: NEGATIVE
Nitrite: NEGATIVE
PH: 6 (ref 5.0–8.0)
PROTEIN: NEGATIVE mg/dL
SPECIFIC GRAVITY, URINE: 1.02 (ref 1.005–1.030)

## 2015-11-25 LAB — CBC
HEMATOCRIT: 37.7 % (ref 36.0–46.0)
Hemoglobin: 13 g/dL (ref 12.0–15.0)
MCH: 28.5 pg (ref 26.0–34.0)
MCHC: 34.5 g/dL (ref 30.0–36.0)
MCV: 82.7 fL (ref 78.0–100.0)
Platelets: 254 10*3/uL (ref 150–400)
RBC: 4.56 MIL/uL (ref 3.87–5.11)
RDW: 14.6 % (ref 11.5–15.5)
WBC: 7 10*3/uL (ref 4.0–10.5)

## 2015-11-25 LAB — URINE MICROSCOPIC-ADD ON

## 2015-11-25 MED ORDER — NAPROXEN 500 MG PO TABS: 500.0000 mg | ORAL_TABLET | Freq: Two times a day (BID) | ORAL | 1 refills | Status: DC | PRN

## 2015-11-25 MED ORDER — NAPROXEN SODIUM 275 MG PO TABS
550.0000 mg | ORAL_TABLET | Freq: Once | ORAL | Status: AC
  Administered 2015-11-25: 550 mg via ORAL
  Filled 2015-11-25: qty 2

## 2015-11-25 NOTE — Telephone Encounter (Signed)
Received a call from an Rn that was seeing Amanda [name] at home. The RN stated she called the office on Friday around 5:00 pm. She stated she told patient to just come to the office to be seen due to cramping really bad. Patient's mother doesn't get off until 3:30 pm, and she would could get here around 4:00 pm.

## 2015-11-25 NOTE — MAU Provider Note (Signed)
First Provider Initiated Contact with Patient 11/25/15 1653      Chief Complaint:  Abdominal Cramping  Amanda [name] is 15 days s/p SVD w/o complications.  She has been having cramps off and on since then. However, the cramps have been worse over the past day or two.  She is not breastfeeding.  Denies fever, abdominal tenderness, vaginal odor. Says bleeding has "gotten lighter."  Denies intercourse since delivery.  She is not in pain now, but when it hits "it hits hard."  Was advised by Home Health RN and call-a-nurse to come to MAU.  Has had "many" normal BMs. Received DMPA 9/12  Past Medical History:  Diagnosis Date  . Hx of chlamydia infection 05/04/14   treated, TOC-neg  . Hx of gonorrhea 05/04/14   treated, TOC-neg  . Hx of wisdom tooth extraction     Past Surgical History:  Procedure Laterality Date  . WISDOM TOOTH EXTRACTION      Family History  Problem Relation Age of Onset  . Adopted: Yes  . Cancer Mother     pseudotumor in brain    Social History  Substance Use Topics  . Smoking status: Never Smoker  . Smokeless tobacco: Never Used  . Alcohol use No    Allergies: No Known Allergies  Prescriptions Prior to Admission  Medication Sig Dispense Refill Last Dose  . acetaminophen (TYLENOL) 500 MG tablet Take 1,000 mg by mouth every 6 (six) hours as needed for mild pain, moderate pain or headache.    Past Month at Unknown time  . ibuprofen (ADVIL,MOTRIN) 600 MG tablet Take 1 tablet (600 mg total) by mouth every 6 (six) hours. 30 tablet 0 11/24/2015 at Unknown time  . Prenatal Vit-Fe Fumarate-FA (PRENATAL MULTIVITAMIN) TABS tablet Take 1 tablet by mouth daily.    Past Month at Unknown time  . senna-docusate (SENOKOT-S) 8.6-50 MG tablet Take 2 tablets by mouth daily. 30 tablet 0 Past Week at Unknown time  . calcium carbonate (TUMS - DOSED IN MG ELEMENTAL CALCIUM) 500 MG chewable tablet Chew 2 tablets by mouth as needed for indigestion or heartburn.   Not Taking at Unknown time      Review of Systems   Constitutional: Negative for fever and chills Eyes: Negative for visual disturbances Respiratory: Negative for shortness of breath, dyspnea Cardiovascular: Negative for chest pain or palpitations  Gastrointestinal: Negative for vomiting, diarrhea and constipation Genitourinary: Negative for dysuria and urgency Musculoskeletal: Negative for back pain, joint pain, myalgias  Neurological: Negative for dizziness and headaches     Physical Exam   Blood pressure 106/61, pulse (!) 59, temperature 98.2 F (36.8 C), temperature source Oral, resp. rate 18, height 5\' 2"  (1.575 m), weight 64 kg (141 lb), not currently breastfeeding.  General: General appearance - alert, well appearing, and in no distress and oriented to person, place, and time Chest - normal respiratory effort Heart - normal rate and regular rhythm Abdomen - soft, nontender Pelvic - Vulva normal.  No lacerations.  SSE:  Scant amount of lochia. No odor.  Uterus well involuted, NO TENDERNESS AT ALL to bimanual. No discharge or odor.  Extremities - no pedal edema noted   Labs: Results for orders placed or performed during the hospital encounter of 11/25/15 (from the past 24 hour(s))  Urinalysis, Routine w reflex microscopic ([facility])   Collection Time: 11/25/15  4:40 PM  Result Value Ref Range   Color, Urine YELLOW YELLOW   APPearance CLEAR CLEAR   Specific Gravity,  Urine 1.020 1.005 - 1.030   pH 6.0 5.0 - 8.0   Glucose, UA NEGATIVE NEGATIVE mg/dL   Hgb urine dipstick SMALL (A) NEGATIVE   Bilirubin Urine NEGATIVE NEGATIVE   Ketones, ur NEGATIVE NEGATIVE mg/dL   Protein, ur NEGATIVE NEGATIVE mg/dL   Nitrite NEGATIVE NEGATIVE   Leukocytes, UA NEGATIVE NEGATIVE  Urine microscopic-add on   Collection Time: 11/25/15  4:40 PM  Result Value Ref Range   Squamous Epithelial / LPF 0-5 (A) NONE SEEN   WBC, UA 0-5 0 - 5 WBC/hpf   RBC / HPF 0-5 0 - 5 RBC/hpf   Bacteria, UA FEW (A) NONE SEEN    Urine-Other MUCOUS PRESENT   CBC   Collection Time: 11/25/15  5:01 PM  Result Value Ref Range   WBC 7.0 4.0 - 10.5 K/uL   RBC 4.56 3.87 - 5.11 MIL/uL   Hemoglobin 13.0 12.0 - 15.0 g/dL   HCT 16.1 09.6 - 04.5 %   MCV 82.7 78.0 - 100.0 fL   MCH 28.5 26.0 - 34.0 pg   MCHC 34.5 30.0 - 36.0 g/dL   RDW 40.9 81.1 - 91.4 %   Platelets 254 150 - 400 K/uL   Imaging Studies:  US Pelvis Complete  Result Date: 11/25/2015 CLINICAL DATA:  Cramping.  Vaginal delivery 15 days ago. EXAM: TRANSABDOMINAL ULTRASOUND OF PELVIS TECHNIQUE: Transabdominal ultrasound examination of the pelvis was performed including evaluation of the uterus, ovaries, adnexal regions, and pelvic cul-de-sac. COMPARISON:  None. FINDINGS: Uterus Measurements: 12.9 by 6.7 by 8.3 cm. No fibroids or other mass visualized. Somewhat hypervascular and hypoechoic myometrium deep to the arcuate vessels. Endometrium Poorly seen and not measurable. Right ovary Measurements: 2.5 by 1.6 by 1.8 cm. Normal appearance/no adnexal mass. Left ovary Measurements: 2.0 by 1.3 by 1.6 cm. Normal appearance/no adnexal mass. Other findings:  No abnormal free fluid. IMPRESSION: 1. Unusual postpartum appearance the uterus with accentuated hypo echogenicity in the myometrium deep to the arcuate vessels. Some of this is due to prominent arcuate vessels. I am unsure whether the rest is due to postpartum hypervascularity, but the appearance causes the endometrial stripe to be indistinct and poorly seen. This could also represent a thickened junctional zone in the setting of adenomyosis. If I am skeptical that endometritis would cause this much hypoechogenicity in the junctional zone, but correlation with any symptoms of endometritis is would be suggested. Electronically Signed   By: Gaylyn Rong M.D.   On: 11/25/2015 18:54   [place] [facility] Ob [facility]  Result Date: 10/31/2015 OBSTETRICAL ULTRASOUND: This exam was performed within a [facility]. The OB  US report was generated in the [facility] system, and faxed to the ordering physician.  This report is available in the [employer]. See the [facility] Obstetric US report via the Image Link.    Assessment:  15 days s/p uncomplicated SVD, involution cramps.  No evidence of retained POC.  PT HAS ABSOLUTELY NO CLINICAL SX OF ENDOMTRITIS    Plan: Rx Naproxen PRN  CRESENZO-DISHMAN,Keitha Kolk

## 2015-11-25 NOTE — Discharge Instructions (Signed)
Dysmenorrhea  (I know that you aren't having menstrual cramps but the information can still apply) Menstrual cramps (dysmenorrhea) are caused by the muscles of the uterus tightening (contracting) during a menstrual period. For some women, this discomfort is merely bothersome. For others, dysmenorrhea can be severe enough to interfere with everyday activities for a few days each month. Primary dysmenorrhea is menstrual cramps that last a couple of days when you start having menstrual periods or soon after. This often begins after a teenager starts having her period. As a woman gets older or has a baby, the cramps will usually lessen or disappear. Secondary dysmenorrhea begins later in life, lasts longer, and the pain may be stronger than primary dysmenorrhea. The pain may start before the period and last a few days after the period.  CAUSES  Dysmenorrhea is usually caused by an underlying problem, such as:  The tissue lining the uterus grows outside of the uterus in other areas of the body (endometriosis).  The endometrial tissue, which normally lines the uterus, is found in or grows into the muscular walls of the uterus (adenomyosis).  The pelvic blood vessels are engorged with blood just before the menstrual period (pelvic congestive syndrome).  Overgrowth of cells (polyps) in the lining of the uterus or cervix.  Falling down of the uterus (prolapse) because of loose or stretched ligaments.  Depression.  Bladder problems, infection, or inflammation.  Problems with the intestine, a tumor, or irritable bowel syndrome.  Cancer of the female organs or bladder.  A severely tipped uterus.  A very tight opening or closed cervix.  Noncancerous tumors of the uterus (fibroids).  Pelvic inflammatory disease (PID).  Pelvic scarring (adhesions) from a previous surgery.  Ovarian cyst.  An intrauterine device (IUD) used for birth control. RISK FACTORS You may be at greater risk of  dysmenorrhea if:  You are younger than age 67.  You started puberty early.  You have irregular or heavy bleeding.  You have never given birth.  You have a family history of this problem.  You are a smoker. SIGNS AND SYMPTOMS   Cramping or throbbing pain in your lower abdomen.  Headaches.  Lower back pain.  Nausea or vomiting.  Diarrhea.  Sweating or dizziness.  Loose stools. DIAGNOSIS  A diagnosis is based on your history, symptoms, physical exam, diagnostic tests, or procedures. Diagnostic tests or procedures may include:  Blood tests.  Ultrasonography.  An examination of the lining of the uterus (dilation and curettage, D&C).  An examination inside your abdomen or pelvis with a scope (laparoscopy).  X-rays.  CT scan.  MRI.  An examination inside the bladder with a scope (cystoscopy).  An examination inside the intestine or stomach with a scope (colonoscopy, gastroscopy). TREATMENT  Treatment depends on the cause of the dysmenorrhea. Treatment may include:  Pain medicine prescribed by your health care provider.  Birth control pills or an IUD with progesterone hormone in it.  Hormone replacement therapy.  Nonsteroidal anti-inflammatory drugs (NSAIDs). These may help stop the production of prostaglandins.  Surgery to remove adhesions, endometriosis, ovarian cyst, or fibroids.  Removal of the uterus (hysterectomy).  Progesterone shots to stop the menstrual period.  Cutting the nerves on the sacrum that go to the female organs (presacral neurectomy).  Electric current to the sacral nerves (sacral nerve stimulation).  Antidepressant medicine.  Psychiatric therapy, counseling, or group therapy.  Exercise and physical therapy.  Meditation and yoga therapy.  Acupuncture. HOME CARE INSTRUCTIONS   Only take over-the-counter  or prescription medicines as directed by your health care provider.  Place a heating pad or hot water bottle on your lower  back or abdomen. Do not sleep with the heating pad.  Use aerobic exercises, walking, swimming, biking, and other exercises to help lessen the cramping.  Massage to the lower back or abdomen may help.  Stop smoking.  Avoid alcohol and caffeine. SEEK MEDICAL CARE IF:   Your pain does not get better with medicine.  You have pain with sexual intercourse.  Your pain increases and is not controlled with medicines.  You have abnormal vaginal bleeding with your period.  You develop nausea or vomiting with your period that is not controlled with medicine. SEEK IMMEDIATE MEDICAL CARE IF:  You pass out.    This information is not intended to replace advice given to you by your health care provider. Make sure you discuss any questions you have with your health care provider.   Document Released: 02/16/2005 Document Revised: 10/19/2012 Document Reviewed: 08/04/2012 Elsevier Interactive Patient Education [employer]2016 Elsevier Inc.

## 2015-11-25 NOTE — MAU Note (Addendum)
States she had a baby on 9/10, spont vag del. States she has been cramping since delivery, worse now. Sometimes she can't hold the baby or stand up straight. Comes and goes, but when it hits it is really bad. States she is not feeling pain right now. States she notices an odor to her vagina but no discharge. Minimal bleeding.

## 2015-11-27 ENCOUNTER — Encounter: Payer: Self-pay | Admitting: *Deleted

## 2015-12-11 ENCOUNTER — Ambulatory Visit: Payer: Medicaid Other | Admitting: Advanced Practice Midwife

## 2015-12-24 ENCOUNTER — Ambulatory Visit (INDEPENDENT_AMBULATORY_CARE_PROVIDER_SITE_OTHER): Payer: Medicaid Other | Admitting: Family

## 2015-12-24 NOTE — Progress Notes (Signed)
Subjective:     Amanda [name] is a 20 y.o. female who presents for a postpartum visit. She is 6 weeks postpartum following a spontaneous vaginal delivery. I have fully reviewed the prenatal and intrapartum course. The delivery was at 4752w4d gestational weeks. Outcome: spontaneous vaginal delivery. Anesthesia: epidural. Postpartum course has been unremarkable. Baby's course has been unremarkable. Baby is feeding by bottle - Similac Advance. Reports doing well with the two younger children; +support at home.    Bleeding no bleeding.  Bled approximately 3 weeks after delivery.   Bowel function is normal. Bladder function is normal. Patient is not sexually active. Contraception method is Depo-Provera injections. Postpartum depression screening: negative.  The following portions of the patient's history were reviewed and updated as appropriate: allergies, current medications, past family history, past medical history, past social history, past surgical history and problem list.  Review of Systems Pertinent items are noted in HPI.   Objective:    BP (!) 96/52   Pulse 63   Wt 147 lb 8 oz (66.9 kg)   BMI 26.98 kg/m   General:  alert, cooperative and appears stated age   Breasts:  inspection negative, no nipple discharge or bleeding, no masses or nodularity palpable  Lungs: clear to auscultation bilaterally  Heart:  regular rate and rhythm, S1, S2 normal, no murmur, click, rub or gallop  Abdomen: soft, non-tender; bowel sounds normal; no masses,  no organomegaly  Pelvic exam not indicated - not bleeding, no pain at vaginal area      Assessment:     Normal postpartum exam. Pap smear not done at today's visit.   Plan:    1. Contraception: Depo-Provera injections 2.  Follow up for scheduled depo-provera  Marlis EdelsonWalidah N Karim, CNM

## 2016-01-29 ENCOUNTER — Ambulatory Visit (INDEPENDENT_AMBULATORY_CARE_PROVIDER_SITE_OTHER): Payer: Medicaid Other | Admitting: *Deleted

## 2016-01-29 VITALS — BP 116/58 | HR 73 | Wt 147.9 lb

## 2016-01-29 DIAGNOSIS — Z3042 Encounter for surveillance of injectable contraceptive: Secondary | ICD-10-CM

## 2016-01-29 MED ORDER — MEDROXYPROGESTERONE ACETATE 150 MG/ML IM SUSP
150.0000 mg | Freq: Once | INTRAMUSCULAR | Status: AC
  Administered 2016-01-29: 150 mg via INTRAMUSCULAR

## 2016-01-29 NOTE — Progress Notes (Signed)
Depo Provera 150 mg given IM as scheduled - tolerated well.  Next injection due 2/14-2/28/18.

## 2016-04-15 ENCOUNTER — Ambulatory Visit (INDEPENDENT_AMBULATORY_CARE_PROVIDER_SITE_OTHER): Payer: Medicaid Other | Admitting: *Deleted

## 2016-04-15 VITALS — BP 108/68 | HR 65 | Wt 146.0 lb

## 2016-04-15 DIAGNOSIS — Z3042 Encounter for surveillance of injectable contraceptive: Secondary | ICD-10-CM

## 2016-04-15 MED ORDER — MEDROXYPROGESTERONE ACETATE 150 MG/ML IM SUSP
150.0000 mg | Freq: Once | INTRAMUSCULAR | Status: AC
  Administered 2016-04-15: 150 mg via INTRAMUSCULAR

## 2016-04-15 NOTE — Progress Notes (Addendum)
Depo Provera 150 mg IM administered as scheduled.  Next injection due 5/2-5/16.  Pt will need Annual exam w/Pap on same day. She was informed of need for Gyn exam and agreed.

## 2016-07-13 ENCOUNTER — Ambulatory Visit (INDEPENDENT_AMBULATORY_CARE_PROVIDER_SITE_OTHER): Payer: Medicaid Other | Admitting: Medical

## 2016-07-13 ENCOUNTER — Encounter: Payer: Self-pay | Admitting: Medical

## 2016-07-13 VITALS — BP 113/72 | HR 75 | Ht 62.0 in | Wt 152.5 lb

## 2016-07-13 DIAGNOSIS — Z3009 Encounter for other general counseling and advice on contraception: Secondary | ICD-10-CM

## 2016-07-13 DIAGNOSIS — Z3042 Encounter for surveillance of injectable contraceptive: Secondary | ICD-10-CM | POA: Diagnosis not present

## 2016-07-13 DIAGNOSIS — Z01419 Encounter for gynecological examination (general) (routine) without abnormal findings: Secondary | ICD-10-CM

## 2016-07-13 MED ORDER — MEDROXYPROGESTERONE ACETATE 150 MG/ML IM SUSP: 150.0000 mg | Freq: Once | INTRAMUSCULAR | Status: DC

## 2016-07-13 MED ORDER — MEDROXYPROGESTERONE ACETATE 150 MG/ML IM SUSP
150.0000 mg | Freq: Once | INTRAMUSCULAR | Status: AC
  Administered 2016-07-13: 150 mg via INTRAMUSCULAR

## 2016-07-13 MED ORDER — MEDROXYPROGESTERONE ACETATE 150 MG/ML IM SUSP: 150.0000 mg | INTRAMUSCULAR | 3 refills | Status: DC

## 2016-07-13 NOTE — Progress Notes (Deleted)
Patient ID: Amanda [name], female   DOB: 08/05/1995, 21 y.o.   MRN: [id number] Subjective:    Amanda [name] is a 21 y.o. female who presents for an annual exam. The patient has no complaints today. The patient is sexually active. GYN screening history: no prior history of gyn screening tests. The patient wears seatbelts: yes. The patient participates in regular exercise: no. Has the patient ever been transfused or tattooed?: yes. The patient reports that there is not domestic violence in her life.   Menstrual History: OB History    Gravida Para Term Preterm AB Living   2 2 2     2    SAB TAB Ectopic Multiple Live Births         0 2      Menarche age: [age] Patient's last menstrual period was 02/13/2016 (approximate). Period Duration (Days): 5-7  Period Pattern: (!) Irregular Menstrual Flow: Moderate Menstrual Control: Maxi pad Dysmenorrhea: (!) Severe Dysmenorrhea Symptoms: Cramping  The following portions of the patient's history were reviewed and updated as appropriate: allergies, current medications, past family history, past medical history, past social history, past surgical history and problem list.  Review of Systems Pertinent items are noted in HPI.    Objective:      History:  Ms. Amanda [name] is a 21 y.o. [id number] who presents to clinic today for ***   The following portions of the patient's history were reviewed and updated as appropriate: allergies, current medications, family history, past medical history, social history, past surgical history and problem list.  Review of Systems:  ROS    Objective:  Physical Exam BP 113/72   Pulse 75   Ht 5\' 2"  (1.575 m)   Wt 152 lb 8 oz (69.2 kg)   LMP 02/13/2016 (Approximate)   Breastfeeding? No   BMI 27.89 kg/m  Physical Exam   Labs and Imaging   Assessment & Plan:  Assessment: ***  Plans: *** Rx for *** Patient to return to [facility] in *** for *** or sooner if symptoms were to change or worsen  Kathlene CoteWenzel, Julie N,  PA-C 07/13/2016 3:46 PM .    Assessment:    Healthy female exam.    Plan:     {gyn plan:13146}

## 2016-07-13 NOTE — Progress Notes (Deleted)
Subjective:    Amanda [name] is a 21 y.o. female who presents for an annual exam. The patient has no complaints today. The patient is sexually active. GYN screening history: no prior history of gyn screening tests. The patient wears seatbelts: yes. The patient participates in regular exercise: no. Has the patient ever been transfused or tattooed?: yes - 1 tattoo. She patient reports that there is not domestic violence in her life.   Menstrual History: OB History    Gravida Para Term Preterm AB Living   2 2 2     2    SAB TAB Ectopic Multiple Live Births         0 2      Menarche age: [age] Patient's last menstrual period was 02/13/2016 (approximate). Period Duration (Days): 5-7  Period Pattern: (!) Irregular Menstrual Flow: Moderate Menstrual Control: Maxi pad Dysmenorrhea: (!) Severe Dysmenorrhea Symptoms: Cramping  The following portions of the patient's history were reviewed and updated as appropriate: allergies, current medications, past family history, past medical history, past social history, past surgical history and problem list.  Review of Systems Pertinent items are noted in HPI.    Objective:   History:  Ms. Amanda [name] is a 21 y.o. [phone] who presents to clinic today for ***   The following portions of the patient's history were reviewed and updated as appropriate: allergies, current medications, family history, past medical history, social history, past surgical history and problem list.  Review of Systems:  ROS    Objective:  Physical Exam BP 113/72   Pulse 75   Ht 5\' 2"  (1.575 m)   Wt 152 lb 8 oz (69.2 kg)   LMP 02/13/2016 (Approximate)   Breastfeeding? No   BMI 27.89 kg/m  Physical Exam   Labs and Imaging   Assessment & Plan:  Assessment: ***  Plans: *** Rx for *** Patient to return to [facility] in *** for *** or sooner if symptoms were to change or worsen  Kathlene CoteWenzel, Julie N, PA-C 07/13/2016 3:37 PM    Assessment:    Healthy female exam.     Plan:     {gyn plan:13146}
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
## 2016-07-13 NOTE — Patient Instructions (Signed)

## 2016-09-24 ENCOUNTER — Other Ambulatory Visit: Payer: Self-pay | Admitting: Obstetrics & Gynecology

## 2016-09-24 DIAGNOSIS — Z3009 Encounter for other general counseling and advice on contraception: Secondary | ICD-10-CM

## 2016-09-24 NOTE — Telephone Encounter (Signed)
Patient said she lost the RX for her Depo that she is scheduled for her on Monday, would like a call back

## 2016-09-28 ENCOUNTER — Ambulatory Visit: Payer: Medicaid Other

## 2016-09-29 MED ORDER — MEDROXYPROGESTERONE ACETATE 150 MG/ML IM SUSP: 150.0000 mg | INTRAMUSCULAR | 3 refills | Status: DC

## 2016-09-29 NOTE — Telephone Encounter (Signed)
Reprinted patient depo provera prescription and verified dates she could get her injection. Patient has until Aug 13 to return for injection. Called patient and given this information. Understanding voiced. Prescription left in front office for patient to pick up.

## 2016-10-01 ENCOUNTER — Ambulatory Visit: Payer: Medicaid Other

## 2016-11-10 ENCOUNTER — Ambulatory Visit: Payer: Self-pay

## 2016-11-10 ENCOUNTER — Ambulatory Visit (INDEPENDENT_AMBULATORY_CARE_PROVIDER_SITE_OTHER): Payer: Medicaid Other | Admitting: Lab

## 2016-11-10 VITALS — BP 119/63 | HR 83 | Wt 158.8 lb

## 2016-11-10 DIAGNOSIS — Z308 Encounter for other contraceptive management: Secondary | ICD-10-CM

## 2016-11-10 DIAGNOSIS — Z3042 Encounter for surveillance of injectable contraceptive: Secondary | ICD-10-CM

## 2016-11-10 MED ORDER — MEDROXYPROGESTERONE ACETATE 150 MG/ML IM SUSP
150.0000 mg | Freq: Once | INTRAMUSCULAR | Status: AC
  Administered 2016-11-10: 150 mg via INTRAMUSCULAR

## 2016-11-10 NOTE — Progress Notes (Signed)
Patient is here today for Depo Injection. Patient stated she has not been having sex. Urine pregnancy test negative.

## 2016-11-11 LAB — POCT PREGNANCY, URINE: PREG TEST UR: NEGATIVE

## 2017-01-06 ENCOUNTER — Ambulatory Visit (HOSPITAL_COMMUNITY)
Admission: EM | Admit: 2017-01-06 | Discharge: 2017-01-06 | Disposition: A | Discharge status: Home / Self Care | Payer: Self-pay | Attending: Emergency Medicine | Admitting: Emergency Medicine

## 2017-01-06 ENCOUNTER — Encounter (HOSPITAL_COMMUNITY): Payer: Self-pay | Admitting: Emergency Medicine

## 2017-01-06 DIAGNOSIS — S161XXA Strain of muscle, fascia and tendon at neck level, initial encounter: Secondary | ICD-10-CM

## 2017-01-06 DIAGNOSIS — S63502A Unspecified sprain of left wrist, initial encounter: Secondary | ICD-10-CM

## 2017-01-06 MED ORDER — METHOCARBAMOL 750 MG PO TABS: 750.0000 mg | ORAL_TABLET | ORAL | 0 refills | Status: DC

## 2017-01-06 MED ORDER — DICLOFENAC SODIUM 75 MG PO TBEC: 75.0000 mg | DELAYED_RELEASE_TABLET | Freq: Two times a day (BID) | ORAL | 0 refills | Status: DC

## 2017-01-06 NOTE — Discharge Instructions (Signed)
diclofenac twice a day with 1 gram of Tylenol.  May take 1 gram of Tylenol and additional 2 more times a day.  Robaxin as needed for muscle spasms.  Follow-up with PMD as needed, to the ER if you get worse.
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
## 2017-01-25 ENCOUNTER — Ambulatory Visit (INDEPENDENT_AMBULATORY_CARE_PROVIDER_SITE_OTHER): Payer: Medicaid Other | Admitting: *Deleted

## 2017-01-25 VITALS — BP 105/68 | HR 85

## 2017-01-25 DIAGNOSIS — Z3042 Encounter for surveillance of injectable contraceptive: Secondary | ICD-10-CM | POA: Diagnosis present

## 2017-01-25 MED ORDER — MEDROXYPROGESTERONE ACETATE 150 MG/ML IM SUSP
150.0000 mg | Freq: Once | INTRAMUSCULAR | Status: AC
  Administered 2017-01-25: 150 mg via INTRAMUSCULAR

## 2017-01-26 ENCOUNTER — Ambulatory Visit: Payer: Medicaid Other

## 2017-02-24 ENCOUNTER — Ambulatory Visit: Payer: Medicaid Other

## 2017-04-12 ENCOUNTER — Ambulatory Visit (INDEPENDENT_AMBULATORY_CARE_PROVIDER_SITE_OTHER): Payer: Medicaid Other | Admitting: *Deleted

## 2017-04-12 VITALS — BP 113/63 | HR 68 | Wt 171.6 lb

## 2017-04-12 DIAGNOSIS — Z3042 Encounter for surveillance of injectable contraceptive: Secondary | ICD-10-CM

## 2017-04-12 MED ORDER — MEDROXYPROGESTERONE ACETATE 150 MG/ML IM SUSP
150.0000 mg | Freq: Once | INTRAMUSCULAR | Status: AC
  Administered 2017-04-12: 150 mg via INTRAMUSCULAR

## 2017-04-12 NOTE — Progress Notes (Signed)
I have reviewed this chart and agree with the RN/CMA assessment and management.    K. Meryl Davis, M.D. Attending Obstetrician & Gynecologist, [facility], [facility]  

## 2017-06-18 ENCOUNTER — Emergency Department (HOSPITAL_COMMUNITY)
Admission: EM | Admit: 2017-06-18 | Discharge: 2017-06-19 | Disposition: A | Discharge status: Home / Self Care | Payer: Medicaid Other | Attending: Emergency Medicine | Admitting: Emergency Medicine

## 2017-06-18 DIAGNOSIS — S91312A Laceration without foreign body, left foot, initial encounter: Secondary | ICD-10-CM | POA: Insufficient documentation

## 2017-06-18 DIAGNOSIS — X58XXXA Exposure to other specified factors, initial encounter: Secondary | ICD-10-CM | POA: Diagnosis not present

## 2017-06-18 DIAGNOSIS — Z23 Encounter for immunization: Secondary | ICD-10-CM | POA: Diagnosis not present

## 2017-06-18 DIAGNOSIS — Y9302 Activity, running: Secondary | ICD-10-CM | POA: Insufficient documentation

## 2017-06-18 DIAGNOSIS — F10920 Alcohol use, unspecified with intoxication, uncomplicated: Secondary | ICD-10-CM | POA: Diagnosis not present

## 2017-06-18 DIAGNOSIS — Y929 Unspecified place or not applicable: Secondary | ICD-10-CM | POA: Insufficient documentation

## 2017-06-18 DIAGNOSIS — Y999 Unspecified external cause status: Secondary | ICD-10-CM | POA: Insufficient documentation

## 2017-06-18 LAB — PREPARE FRESH FROZEN PLASMA
Unit division: 0
Unit division: 0

## 2017-06-18 LAB — BPAM FFP
BLOOD PRODUCT EXPIRATION DATE: 201904222359
Blood Product Expiration Date: 201904232359
ISSUE DATE / TIME: 201904192355
ISSUE DATE / TIME: 201904192355
Unit Type and Rh: 6200
Unit Type and Rh: 6200

## 2017-06-18 MED ORDER — LIDOCAINE-EPINEPHRINE (PF) 2 %-1:200000 IJ SOLN
20.0000 mL | Freq: Once | INTRAMUSCULAR | Status: AC
  Administered 2017-06-19: 20 mL
  Filled 2017-06-18: qty 20

## 2017-06-18 MED ORDER — TETANUS-DIPHTH-ACELL PERTUSSIS 5-2.5-18.5 LF-MCG/0.5 IM SUSP
INTRAMUSCULAR | Status: AC
  Filled 2017-06-18: qty 0.5

## 2017-06-18 MED ORDER — TETANUS-DIPHTH-ACELL PERTUSSIS 5-2.5-18.5 LF-MCG/0.5 IM SUSP
0.5000 mL | Freq: Once | INTRAMUSCULAR | Status: AC
  Administered 2017-06-19: 0.5 mL via INTRAMUSCULAR

## 2017-06-18 NOTE — ED Notes (Addendum)
Down graded to a level 2
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
## 2017-06-18 NOTE — ED Notes (Signed)
bp 94/50

## 2017-06-19 ENCOUNTER — Other Ambulatory Visit: Payer: Self-pay

## 2017-06-19 ENCOUNTER — Encounter (HOSPITAL_COMMUNITY): Payer: Self-pay | Admitting: *Deleted

## 2017-06-19 LAB — BPAM RBC
BLOOD PRODUCT EXPIRATION DATE: 201905072359
Blood Product Expiration Date: 201905082359
ISSUE DATE / TIME: 201904192354
ISSUE DATE / TIME: 201904192354
UNIT TYPE AND RH: 9500
Unit Type and Rh: 9500

## 2017-06-19 NOTE — ED Notes (Signed)
Pt being sutured by pa

## 2017-06-19 NOTE — Progress Notes (Signed)
Chaplain responded to Level 1 downgraded to level 2.  PT asked that family be called (mother and father).  Phone call had been made , not by Chaplain.  Family arrived, no further interaction needed at this time

## 2017-06-19 NOTE — ED Notes (Signed)
Wound cleaned with soap and water  neospirin ointment applied and dsd bandage placed
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
## 2017-06-19 NOTE — ED Notes (Signed)
Parents at the bedside.

## 2017-06-21 ENCOUNTER — Other Ambulatory Visit: Payer: Self-pay

## 2017-06-21 ENCOUNTER — Ambulatory Visit (HOSPITAL_COMMUNITY)
Admission: EM | Admit: 2017-06-21 | Discharge: 2017-06-21 | Disposition: A | Discharge status: Home / Self Care | Payer: Medicaid Other | Attending: Family Medicine | Admitting: Family Medicine

## 2017-06-21 ENCOUNTER — Encounter (HOSPITAL_COMMUNITY): Payer: Self-pay

## 2017-06-21 DIAGNOSIS — S90412A Abrasion, left great toe, initial encounter: Secondary | ICD-10-CM

## 2017-06-21 DIAGNOSIS — W25XXXA Contact with sharp glass, initial encounter: Secondary | ICD-10-CM

## 2017-06-21 DIAGNOSIS — S91312A Laceration without foreign body, left foot, initial encounter: Secondary | ICD-10-CM

## 2017-06-21 DIAGNOSIS — S90415S Abrasion, left lesser toe(s), sequela: Principal | ICD-10-CM

## 2017-06-21 DIAGNOSIS — L089 Local infection of the skin and subcutaneous tissue, unspecified: Secondary | ICD-10-CM

## 2017-06-21 DIAGNOSIS — S91112D Laceration without foreign body of left great toe without damage to nail, subsequent encounter: Secondary | ICD-10-CM

## 2017-06-21 MED ORDER — LIDOCAINE VISCOUS 2 % MT SOLN
OROMUCOSAL | Status: AC
  Filled 2017-06-21: qty 15

## 2017-06-21 MED ORDER — BACITRACIN ZINC 500 UNIT/GM EX OINT
TOPICAL_OINTMENT | CUTANEOUS | Status: AC
  Filled 2017-06-21: qty 9

## 2017-06-21 MED ORDER — MUPIROCIN CALCIUM 2 % EX CREA: 1.0000 "application " | TOPICAL_CREAM | Freq: Every day | CUTANEOUS | 0 refills | Status: DC

## 2017-06-21 MED ORDER — NAPROXEN 375 MG PO TABS: 375.0000 mg | ORAL_TABLET | Freq: Two times a day (BID) | ORAL | 0 refills | Status: DC | PRN

## 2017-06-21 MED ORDER — LIDOCAINE-EPINEPHRINE-TETRACAINE (LET) SOLUTION
NASAL | Status: AC
  Filled 2017-06-21: qty 3

## 2017-06-21 NOTE — Discharge Instructions (Addendum)
Clean wound with warm water and mild soap.   Change dressing daily with bactroban ointment and non-adhering dressing with pressure gauze and coban  Keep foot elevated Alternate naproxen and tylenol as needed for pain Follow up with PCP for suture removal Return here or go to ER if you have any new or worsening symptoms and/or signs of infection such as fever, chills, redness, discharge, streaking.
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
## 2017-06-21 NOTE — ED Triage Notes (Signed)
Pt presents with having gauze stuck to a wound. She had stitches placed Friday night that are not giving her any problems, she has a wound beside stitches she cannot get the gauze off of.

## 2017-06-30 ENCOUNTER — Ambulatory Visit: Payer: Medicaid Other
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
## 2017-07-01 ENCOUNTER — Ambulatory Visit: Payer: Self-pay

## 2017-07-25 IMAGING — US US PELVIS COMPLETE
1 series · 15 of 24 positions shown · non-contrast
Comparison: None.

CLINICAL DATA: Cramping.  Vaginal delivery 15 days ago.

EXAM:
TRANSABDOMINAL ULTRASOUND OF PELVIS
TECHNIQUE: Transabdominal ultrasound examination of the pelvis was performed
including evaluation of the uterus, ovaries, adnexal regions, and
pelvic cul-de-sac.

[Series 1: us pelvis complete · 15 of 24 slices shown]
[im 1/24]
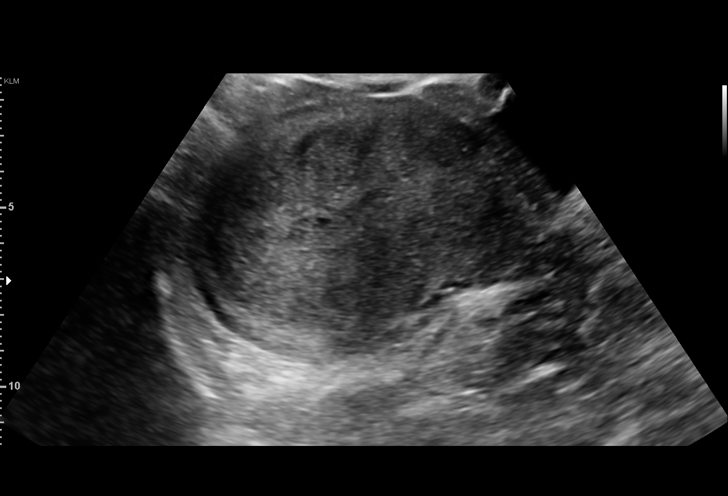
[im 3/24]
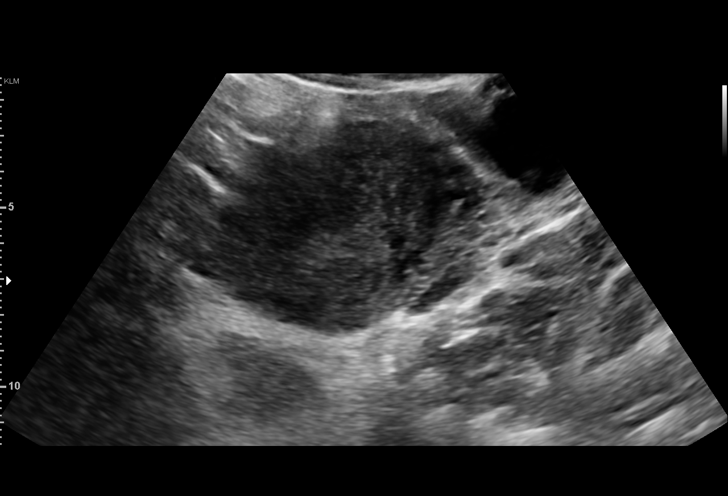
[im 5/24]
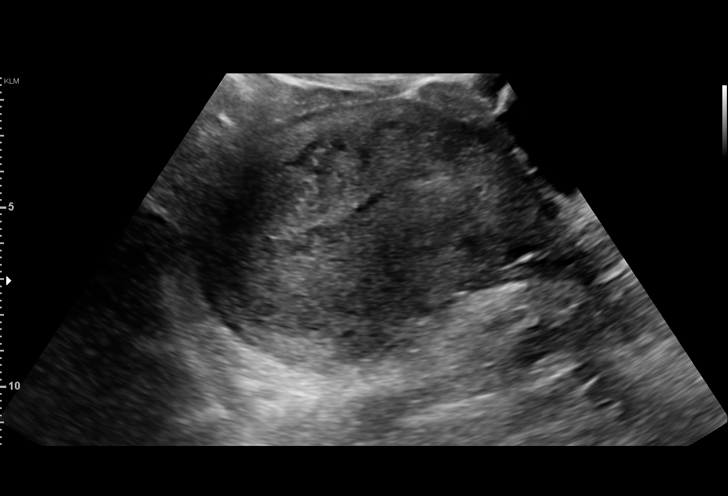
[im 6/24]
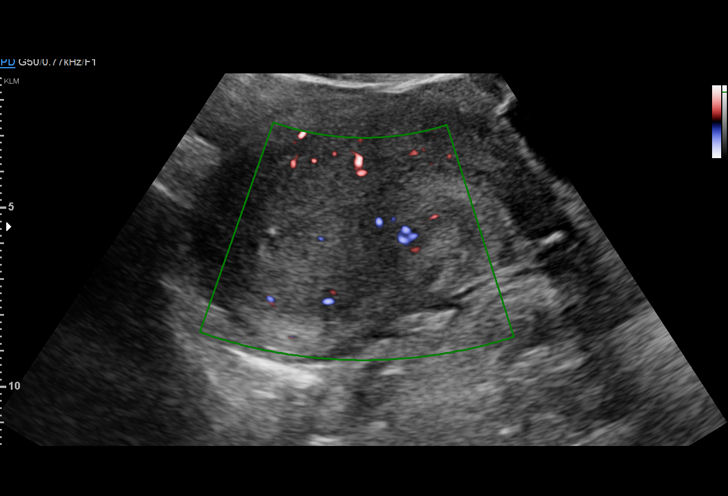
[im 8/24]
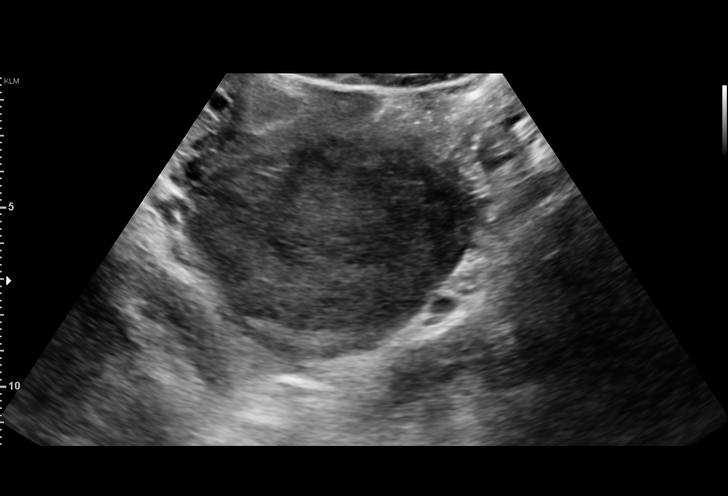
[im 9/24]
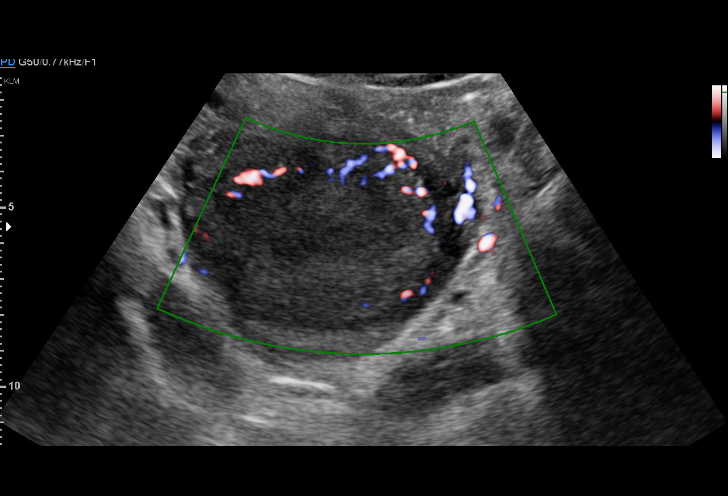
[im 11/24]
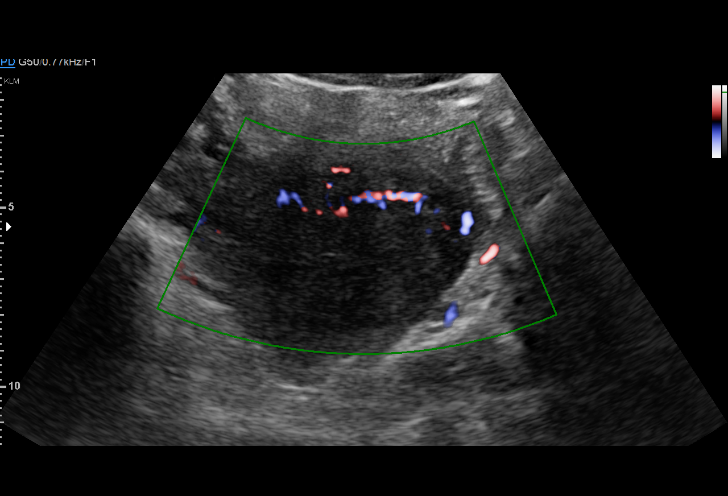
[im 13/24]
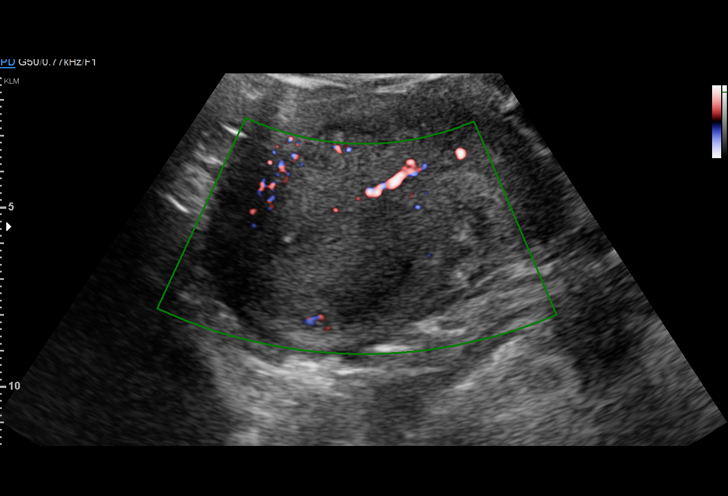
[im 14/24]
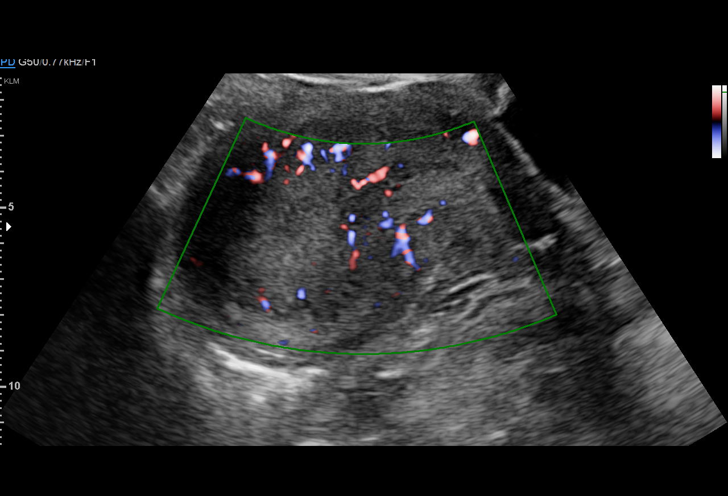
[im 16/24]
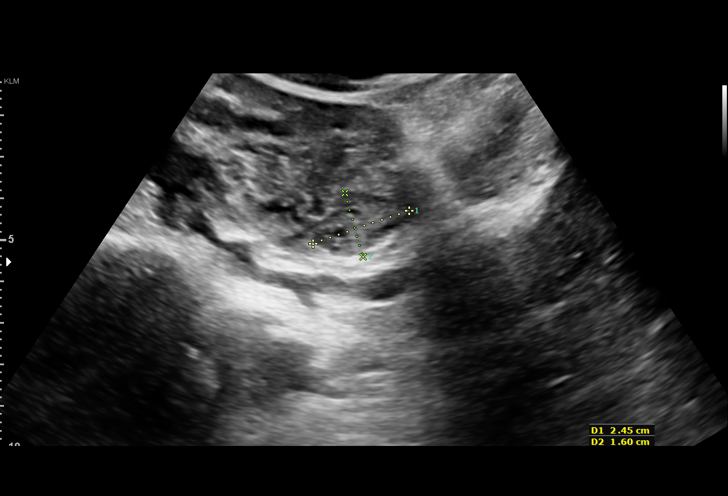
[im 17/24]
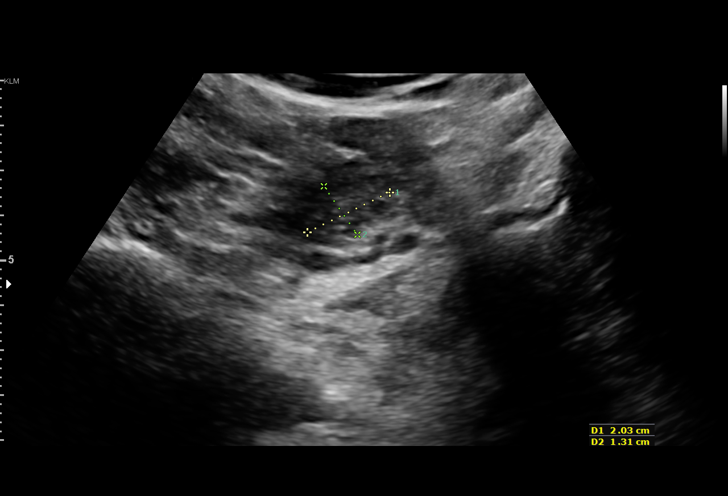
[im 19/24]
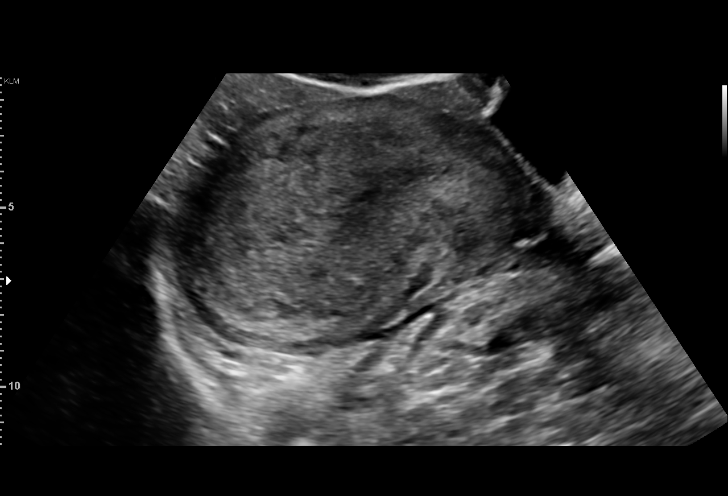
[im 21/24]
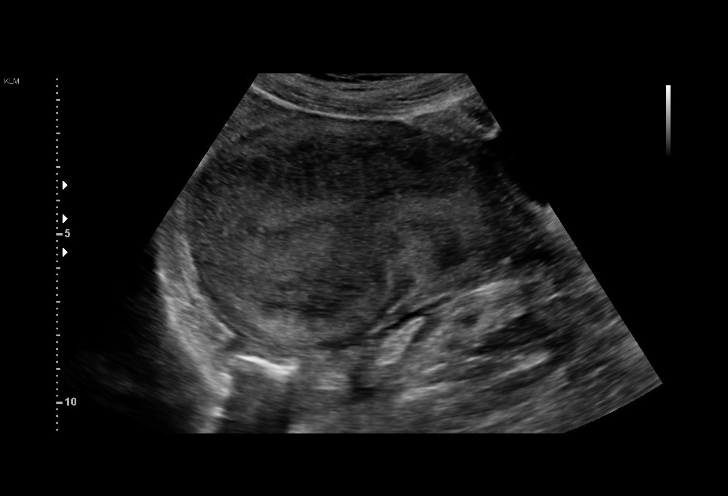
[im 22/24]
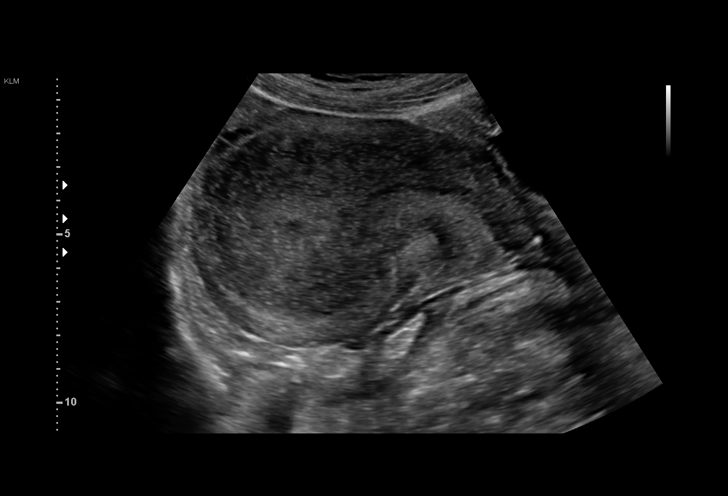
[im 24/24]
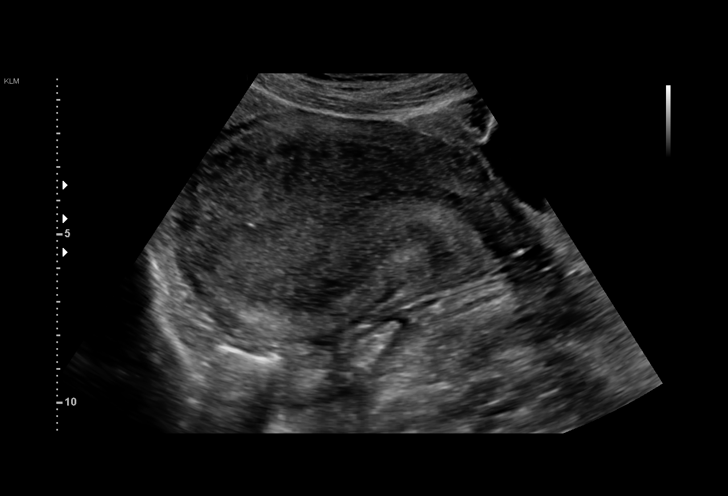

[15 of 24 positions shown; findings below may reference images not displayed]

FINDINGS: Uterus

Measurements: 12.9 by 6.7 by 8.3 cm. No fibroids or other mass
visualized. Somewhat hypervascular and hypoechoic myometrium deep to
the arcuate vessels.

Endometrium

Poorly seen and not measurable.

Right ovary

Measurements: 2.5 by 1.6 by 1.8 cm. Normal appearance/no adnexal
mass.

Left ovary

Measurements: 2.0 by 1.3 by 1.6 cm. Normal appearance/no adnexal
mass.

Other findings:  No abnormal free fluid.
IMPRESSION: 1. Unusual postpartum appearance the uterus with accentuated hypo
echogenicity in the myometrium deep to the arcuate vessels. Some of
this is due to prominent arcuate vessels. I am unsure whether the
rest is due to postpartum hypervascularity, but the appearance
causes the endometrial stripe to be indistinct and poorly seen. This
could also represent a thickened junctional zone in the setting of
adenomyosis. If I am skeptical that endometritis would cause this
much hypoechogenicity in the junctional zone, but correlation with
any symptoms of endometritis is would be suggested.

## 2017-08-02 ENCOUNTER — Inpatient Hospital Stay (HOSPITAL_COMMUNITY)
Admission: AD | Admit: 2017-08-02 | Discharge: 2017-08-02 | Disposition: A | Discharge status: Home / Self Care | Payer: Medicaid Other | Source: Ambulatory Visit | Attending: Family Medicine | Admitting: Family Medicine

## 2017-08-02 ENCOUNTER — Encounter (HOSPITAL_COMMUNITY): Payer: Self-pay

## 2017-08-02 ENCOUNTER — Other Ambulatory Visit: Payer: Self-pay

## 2017-08-02 DIAGNOSIS — Z9889 Other specified postprocedural states: Secondary | ICD-10-CM | POA: Insufficient documentation

## 2017-08-02 DIAGNOSIS — N939 Abnormal uterine and vaginal bleeding, unspecified: Secondary | ICD-10-CM | POA: Diagnosis not present

## 2017-08-02 DIAGNOSIS — Z8619 Personal history of other infectious and parasitic diseases: Secondary | ICD-10-CM | POA: Insufficient documentation

## 2017-08-02 DIAGNOSIS — Z711 Person with feared health complaint in whom no diagnosis is made: Secondary | ICD-10-CM

## 2017-08-02 DIAGNOSIS — Z3202 Encounter for pregnancy test, result negative: Secondary | ICD-10-CM | POA: Diagnosis not present

## 2017-08-02 DIAGNOSIS — Z3009 Encounter for other general counseling and advice on contraception: Secondary | ICD-10-CM

## 2017-08-02 LAB — URINALYSIS, ROUTINE W REFLEX MICROSCOPIC
Bilirubin Urine: NEGATIVE
Glucose, UA: NEGATIVE mg/dL
Hgb urine dipstick: NEGATIVE
Ketones, ur: NEGATIVE mg/dL
Leukocytes, UA: NEGATIVE
Nitrite: NEGATIVE
Protein, ur: NEGATIVE mg/dL
Specific Gravity, Urine: 1.029 (ref 1.005–1.030)
pH: 5 (ref 5.0–8.0)

## 2017-08-02 LAB — WET PREP, GENITAL
Clue Cells Wet Prep HPF POC: NONE SEEN
Sperm: NONE SEEN
Trich, Wet Prep: NONE SEEN
Yeast Wet Prep HPF POC: NONE SEEN

## 2017-08-02 LAB — POCT PREGNANCY, URINE: Preg Test, Ur: NEGATIVE

## 2017-08-02 LAB — GC/CHLAMYDIA PROBE AMP (~~LOC~~) NOT AT ARMC
Chlamydia: NEGATIVE
Neisseria Gonorrhea: NEGATIVE

## 2017-08-02 MED ORDER — KETOROLAC TROMETHAMINE 60 MG/2ML IM SOLN
60.0000 mg | Freq: Once | INTRAMUSCULAR | Status: DC
  Filled 2017-08-02: qty 2

## 2017-08-02 NOTE — MAU Provider Note (Signed)
Chief Complaint: Vaginal Bleeding; Emesis; and Nausea   First Provider Initiated Contact with Patient 08/02/17 0522      SUBJECTIVE HPI: Amanda [name] is a 22 y.o. G2P2002 not currently pregnant who presents to maternity admissions reporting vaginal bleeding and abdominal pain. She reports vaginal bleeding started one week ago. She reports light to heavy bleeding for the past week. She denies large clots or soaking through pads. She was on Depo for contraception and missed her injection on May 1st- rescheduled and missed the next appointment. She reports lower abdominal pain is associated with the vaginal bleeding. She rates pain 7/10- has not taken any medication for abdominal pain.     Past Medical History:  Diagnosis Date  . Hx of chlamydia infection 05/04/14   treated, TOC-neg  . Hx of gonorrhea 05/04/14   treated, TOC-neg  . Hx of wisdom tooth extraction    Past Surgical History:  Procedure Laterality Date  . WISDOM TOOTH EXTRACTION     Social History   Socioeconomic History  . Marital status: Single    Spouse name: Not on file  . Number of children: Not on file  . Years of education: Not on file  . Highest education level: Not on file  Occupational History  . Not on file  Social Needs  . Financial resource strain: Not on file  . Food insecurity:    Worry: Not on file    Inability: Not on file  . Transportation needs:    Medical: Not on file    Non-medical: Not on file  Tobacco Use  . Smoking status: Never Smoker  . Smokeless tobacco: Never Used  Substance and Sexual Activity  . Alcohol use: Yes    Alcohol/week: 0.6 oz    Types: 1 Glasses of wine per week    Comment: 08/01/17 was last you use  . Drug use: Yes    Types: Marijuana    Comment: 08/01/17 was last use  . Sexual activity: Yes    Birth control/protection: Condom  Lifestyle  . Physical activity:    Days per week: Not on file    Minutes per session: Not on file  . Stress: Not on file  Relationships  .  Social connections:    Talks on phone: Not on file    Gets together: Not on file    Attends religious service: Not on file    Active member of club or organization: Not on file    Attends meetings of clubs or organizations: Not on file    Relationship status: Not on file  . Intimate partner violence:    Fear of current or ex partner: Not on file    Emotionally abused: Not on file    Physically abused: Not on file    Forced sexual activity: Not on file  Other Topics Concern  . Not on file  Social History Narrative   ** Merged History Encounter **       No current facility-administered medications on file prior to encounter.    Current Outpatient Medications on File Prior to Encounter  Medication Sig Dispense Refill  . acetaminophen (TYLENOL) 500 MG tablet Take 1,000 mg by mouth every 6 (six) hours as needed for mild pain, moderate pain or headache.     . calcium carbonate (TUMS - DOSED IN MG ELEMENTAL CALCIUM) 500 MG chewable tablet Chew 2 tablets by mouth as needed for indigestion or heartburn.    . diclofenac (VOLTAREN) 75 MG EC tablet Take  1 tablet (75 mg total) 2 (two) times daily by mouth. Take with food (Patient not taking: Reported on 01/25/2017) 30 tablet 0  . medroxyPROGESTERone (DEPO-PROVERA) 150 MG/ML injection Inject 1 mL (150 mg total) into the muscle every 3 (three) months. 1 mL 3  . medroxyPROGESTERone (DEPO-PROVERA) 150 MG/ML injection Inject 150 mg into the muscle every 3 (three) months.    . methocarbamol (ROBAXIN) 750 MG tablet Take 1 tablet (750 mg total) every 4 (four) hours by mouth. 40 tablet 0  . mupirocin cream (BACTROBAN) 2 % Apply 1 application topically daily. 15 g 0  . naproxen (NAPROSYN) 375 MG tablet Take 1 tablet (375 mg total) by mouth 2 (two) times daily as needed for moderate pain. 30 tablet 0  . Prenatal Vit-Fe Fumarate-FA (PRENATAL MULTIVITAMIN) TABS tablet Take 1 tablet by mouth daily.      No Known Allergies  ROS:  Review of Systems   Respiratory: Negative.   Cardiovascular: Negative.   Gastrointestinal: Positive for abdominal pain. Negative for constipation and diarrhea.  Genitourinary: Positive for menstrual problem and vaginal bleeding. Negative for difficulty urinating, dysuria, frequency, urgency and vaginal discharge.   I have reviewed patient's Past Medical Hx, Surgical Hx, Family Hx, Social Hx, medications and allergies.   Physical Exam   Patient Vitals for the past 24 hrs:  BP Temp Temp src Pulse Resp SpO2 Height Weight  08/02/17 0555 (!) 102/56 - - (!) 58 18 - - -  08/02/17 0444 111/77 98.5 F (36.9 C) Oral 61 18 96 % 5\' 2"  (1.575 m) 173 lb (78.5 kg)   Constitutional: Well-developed, well-nourished female in no acute distress.  Cardiovascular: normal rate Respiratory: normal effort GI: Abd soft, non-tender. Pos BS x 4 Neurologic: Alert and oriented x 4.   PELVIC EXAM: Cervix pink, visually closed, without lesion, scant white creamy discharge- NO VAGINAL BLEEDING NOTED, vaginal walls and external genitalia normal Bimanual exam: Cervix 0/long/high, firm, anterior, neg CMT, uterus nontender, nonenlarged, adnexa without tenderness, enlargement, or mass   LAB RESULTS Results for orders placed or performed during the hospital encounter of 08/02/17 (from the past 24 hour(s))  Urinalysis, Routine w reflex microscopic     Status: None   Collection Time: 08/02/17  4:39 AM  Result Value Ref Range   Color, Urine YELLOW YELLOW   APPearance CLEAR CLEAR   Specific Gravity, Urine 1.029 1.005 - 1.030   pH 5.0 5.0 - 8.0   Glucose, UA NEGATIVE NEGATIVE mg/dL   Hgb urine dipstick NEGATIVE NEGATIVE   Bilirubin Urine NEGATIVE NEGATIVE   Ketones, ur NEGATIVE NEGATIVE mg/dL   Protein, ur NEGATIVE NEGATIVE mg/dL   Nitrite NEGATIVE NEGATIVE   Leukocytes, UA NEGATIVE NEGATIVE  Pregnancy, urine POC     Status: None   Collection Time: 08/02/17  5:02 AM  Result Value Ref Range   Preg Test, Ur NEGATIVE NEGATIVE  Wet  prep, genital     Status: Abnormal   Collection Time: 08/02/17  5:22 AM  Result Value Ref Range   Yeast Wet Prep HPF POC NONE SEEN NONE SEEN   Trich, Wet Prep NONE SEEN NONE SEEN   Clue Cells Wet Prep HPF POC NONE SEEN NONE SEEN   WBC, Wet Prep HPF POC FEW (A) NONE SEEN   Sperm NONE SEEN      MAU Management/MDM: Orders Placed This Encounter  Procedures  . Wet prep, genital  . Urinalysis, Routine w reflex microscopic  . Pregnancy, urine POC  . Discharge patient Discharge disposition:  01-Home or Self Care; Discharge patient date: 08/02/2017   Wet prep- negative  GC/C- negative  UA- negative  UPT- negative   Meds ordered this encounter  Medications  . ketorolac (TORADOL) injection 60 mg    Treatments in MAU included 60mg  IM toradol- patient declined medication treatment, reports "pain is gone now that she knows she is not pregnant". Pt discharged. Discussed and education on contraception- pt reports not wanting to be on Depo anymore.   ASSESSMENT 1. Physically well but worried   2. General counseling and advice on contraceptive management     PLAN Discharge home Return to PCP or urgent care for worsening symptoms  Make appointment to be seen for contraceptive management   Follow-up Information    [facility] Follow up.   Specialty:  Urgent Care Why:  Return to PCP or urgent care for worsening symptoms  Contact information: [address] [place] [address] [phone]          Allergies as of 08/02/2017   No Known Allergies     Medication List    TAKE these medications   acetaminophen 500 MG tablet Commonly known as:  TYLENOL Take 1,000 mg by mouth every 6 (six) hours as needed for mild pain, moderate pain or headache.   calcium carbonate 500 MG chewable tablet Commonly known as:  TUMS - dosed in mg elemental calcium Chew 2 tablets by mouth as needed for indigestion or heartburn.   diclofenac 75 MG EC  tablet Commonly known as:  VOLTAREN Take 1 tablet (75 mg total) 2 (two) times daily by mouth. Take with food   medroxyPROGESTERone 150 MG/ML injection Commonly known as:  DEPO-PROVERA Inject 150 mg into the muscle every 3 (three) months.   medroxyPROGESTERone 150 MG/ML injection Commonly known as:  DEPO-PROVERA Inject 1 mL (150 mg total) into the muscle every 3 (three) months.   methocarbamol 750 MG tablet Commonly known as:  ROBAXIN Take 1 tablet (750 mg total) every 4 (four) hours by mouth.   mupirocin cream 2 % Commonly known as:  BACTROBAN Apply 1 application topically daily.   naproxen 375 MG tablet Commonly known as:  NAPROSYN Take 1 tablet (375 mg total) by mouth 2 (two) times daily as needed for moderate pain.   prenatal multivitamin Tabs tablet Take 1 tablet by mouth daily.      Steward Drone  Certified Nurse-Midwife 08/03/2017  2:55 AM

## 2017-08-02 NOTE — Discharge Instructions (Signed)
In late 2019, the [facility] will be moving to the [place] campus. At that time, the MAU (Maternity Admissions Unit), where you are being seen today, will no longer take care of non-pregnant patients. We strongly encourage you to find a doctor's office before that time, so that you can be seen with any GYN concerns, like vaginal discharge, urinary tract infection, etc.. in a timely manner. ° °In order to make an office visit more convenient, the [facility] will be offering evening hours with same-day appointments, walk-in appointments and scheduled appointments available during this time. ° °[facility] Hours: °Monday - 8am - 7:30 pm with walk-in between 4pm- 7:30 pm °Tuesday - 8 am - 5 pm (starting 06/01/17 we will be open late and accepting walk-ins from 4pm - 7:30pm) °Wednesday - 8 am - 5 pm (starting 09/01/17 we will be open late and accepting walk-ins from 4pm - 7:30pm) °Thursday 8 am - 5 pm (starting 12/02/17 we will be open late and accepting walk-ins from 4pm - 7:30pm) °Friday 8 am - 5 pm ° °For an appointment please call the [facility] at [phone] ° °For urgent needs, [facility] is also available for management of urgent GYN complaints such as vaginal discharge or urinary tract infections. ° ° ° ° ° °

## 2017-08-02 NOTE — MAU Note (Signed)
Pt. Reports constant pain in lower abdomen. Rates pain 7/10. States it started a week ago. Denies being pregnant but states a "blank upt" and another with "double lines that were faded" . Reports last date of intercourse was May 2nd, and condom was used. States missed Depo injection that was due on May 1st. Reports vaginal discharge and bleeding. Reports vaginal bleeding that began 2 weeks ago.

## 2017-08-02 NOTE — MAU Note (Addendum)
Pt reports vaginal bleeding and lower abdominal pain that started 1 week ago. States the bleeding is sometimes heavy and sometimes light. Pt states she missed her depo injection on May 1st. States she had a "blank upt" at home.

## 2017-08-16 ENCOUNTER — Other Ambulatory Visit: Payer: Self-pay

## 2017-08-16 ENCOUNTER — Emergency Department (HOSPITAL_COMMUNITY)
Admission: EM | Admit: 2017-08-16 | Discharge: 2017-08-16 | Disposition: A | Discharge status: Home / Self Care | Payer: Medicaid Other | Attending: Emergency Medicine | Admitting: Emergency Medicine

## 2017-08-16 ENCOUNTER — Emergency Department (HOSPITAL_COMMUNITY): Payer: Medicaid Other

## 2017-08-16 ENCOUNTER — Encounter (HOSPITAL_COMMUNITY): Payer: Self-pay

## 2017-08-16 DIAGNOSIS — R1031 Right lower quadrant pain: Secondary | ICD-10-CM | POA: Diagnosis present

## 2017-08-16 DIAGNOSIS — N390 Urinary tract infection, site not specified: Secondary | ICD-10-CM | POA: Diagnosis not present

## 2017-08-16 DIAGNOSIS — Z79899 Other long term (current) drug therapy: Secondary | ICD-10-CM | POA: Diagnosis not present

## 2017-08-16 DIAGNOSIS — R102 Pelvic and perineal pain: Secondary | ICD-10-CM | POA: Diagnosis not present

## 2017-08-16 LAB — WET PREP, GENITAL
Clue Cells Wet Prep HPF POC: NONE SEEN
Sperm: NONE SEEN
Trich, Wet Prep: NONE SEEN
Yeast Wet Prep HPF POC: NONE SEEN

## 2017-08-16 LAB — CBC
HEMATOCRIT: 41.2 % (ref 36.0–46.0)
HEMOGLOBIN: 13.3 g/dL (ref 12.0–15.0)
MCH: 30.2 pg (ref 26.0–34.0)
MCHC: 32.3 g/dL (ref 30.0–36.0)
MCV: 93.6 fL (ref 78.0–100.0)
Platelets: 201 10*3/uL (ref 150–400)
RBC: 4.4 MIL/uL (ref 3.87–5.11)
RDW: 12.3 % (ref 11.5–15.5)
WBC: 8.9 10*3/uL (ref 4.0–10.5)

## 2017-08-16 LAB — COMPREHENSIVE METABOLIC PANEL
ALBUMIN: 4.1 g/dL (ref 3.5–5.0)
ALT: 17 U/L (ref 14–54)
ANION GAP: 9 (ref 5–15)
AST: 22 U/L (ref 15–41)
Alkaline Phosphatase: 61 U/L (ref 38–126)
BUN: 14 mg/dL (ref 6–20)
CHLORIDE: 105 mmol/L (ref 101–111)
CO2: 26 mmol/L (ref 22–32)
Calcium: 9.7 mg/dL (ref 8.9–10.3)
Creatinine, Ser: 0.95 mg/dL (ref 0.44–1.00)
GFR calc Af Amer: >60 mL/min (ref >60)
GFR calc non Af Amer: >60 mL/min (ref >60)
GLUCOSE: 116 mg/dL — AB (ref 65–99)
POTASSIUM: 4 mmol/L (ref 3.5–5.1)
SODIUM: 140 mmol/L (ref 135–145)
Total Bilirubin: 1.1 mg/dL (ref 0.3–1.2)
Total Protein: 7.7 g/dL (ref 6.5–8.1)

## 2017-08-16 LAB — I-STAT BETA HCG BLOOD, ED (MC, WL, AP ONLY)

## 2017-08-16 LAB — URINALYSIS, ROUTINE W REFLEX MICROSCOPIC
BILIRUBIN URINE: NEGATIVE
Glucose, UA: NEGATIVE mg/dL
KETONES UR: NEGATIVE mg/dL
Nitrite: NEGATIVE
Protein, ur: 100 mg/dL — AB
RBC / HPF: >50 RBC/hpf — ABNORMAL HIGH (ref 0–5)
Specific Gravity, Urine: 1.025 (ref 1.005–1.030)
pH: 5 (ref 5.0–8.0)

## 2017-08-16 LAB — LIPASE, BLOOD: LIPASE: 30 U/L (ref 11–51)

## 2017-08-16 MED ORDER — CEPHALEXIN 500 MG PO CAPS: 500.0000 mg | ORAL_CAPSULE | Freq: Three times a day (TID) | ORAL | 0 refills | Status: AC

## 2017-08-16 NOTE — ED Notes (Signed)
Pt alert and oriented in NAD. Pt verbalized understanding of discharge instructions. 

## 2017-08-16 NOTE — ED Triage Notes (Signed)
Pt states she has been having abdominal pain and pain when walking for a couple of weeks. Pt states she has taken several home pregnancy tests some with faint positives. Pt states she has been out of birth control for a month.

## 2017-08-16 NOTE — Discharge Instructions (Addendum)
Please call for your test results in 3-5 days. If your gonorrhea or chlamydia tests are positive you will need to be treated. Follow up with your doctor as discussed, referrals given for community resources if needed. Return to the ER for any worsening or concerning symptoms. Your pregnancy test today is negative. Your blood work today checked for infection, anemia, kidney and liver function and electrolytes and is all normal. Possible urinary tract infection, take Keflex as prescribed and complete the full course.

## 2017-08-16 NOTE — ED Provider Notes (Signed)
[facility]ENC[facility] Provider Note   CSN: [id number] Arrival date & time: 08/16/17  1132     History   Chief Complaint Chief Complaint  Patient presents with  . Abdominal Pain    HPI Amanda [name] is a 22 y.o. female.  22 year old female presents with complaint of lower abdominal pain x1 month.  Patient reports right lower quadrant pain onset today, worse with walking.  Also reports a sensitivity to smell, next refill nauseous, states that she has nausea and vomiting daily and that she is moody and believes that she is pregnant.  Patient states that she has had 5 at home pregnancy tests that were faintly positive however states that these faint positives were resulted well beyond the recommended window of time to read her test result.  Dates that she was seen for this exact complaint this month at [facility] and was told that she was not pregnant however requests a blood test and states that with the previous pregnancy she did not have a positive urine test.  Patient states that she has not had any vaginal bleeding for the past 2 years since the birth of her previous child, but notes that one episode of light spotting several months ago when she had a cut to her ankle however states absolutely no vaginal bleeding otherwise x2 years.  Denies changes in bowel or bladder habits.  No other complaints or concerns.     Past Medical History:  Diagnosis Date  . Hx of chlamydia infection 05/04/14   treated, TOC-neg  . Hx of gonorrhea 05/04/14   treated, TOC-neg  . Hx of wisdom tooth extraction     Patient Active Problem List   Diagnosis Date Noted  . Fall 10/30/2015    Past Surgical History:  Procedure Laterality Date  . WISDOM TOOTH EXTRACTION       OB History    Gravida  2   Para  2   Term  2   Preterm  0   AB  0   Living  2     SAB  0   TAB  0   Ectopic  0   Multiple      Live Births  2            Home Medications     Prior to Admission medications   Medication Sig Start Date End Date Taking? Authorizing Provider  acetaminophen (TYLENOL) 500 MG tablet Take 1,000 mg by mouth every 6 (six) hours as needed for mild pain, moderate pain or headache.    Yes [provider]  ibuprofen (ADVIL,MOTRIN) 200 MG tablet Take 800 mg by mouth every 6 (six) hours as needed.   Yes [provider]  cephALEXin (KEFLEX) 500 MG capsule Take 1 capsule (500 mg total) by mouth 3 (three) times daily for 7 days. 08/16/17 08/23/17  Jeannie FendMurphy, Linnae Rasool A, PA-C  diclofenac (VOLTAREN) 75 MG EC tablet Take 1 tablet (75 mg total) 2 (two) times daily by mouth. Take with food Patient not taking: Reported on 01/25/2017 01/06/17   Domenick GongMortenson, Ashley, MD  medroxyPROGESTERone (DEPO-PROVERA) 150 MG/ML injection Inject 1 mL (150 mg total) into the muscle every 3 (three) months. Patient not taking: Reported on 08/16/2017 09/29/16 06/27/17  Judeth HornLawrence, Erin, NP  methocarbamol (ROBAXIN) 750 MG tablet Take 1 tablet (750 mg total) every 4 (four) hours by mouth. Patient not taking: Reported on 08/16/2017 01/06/17   Domenick GongMortenson, Ashley, MD  mupirocin cream (BACTROBAN) 2 % Apply  1 application topically daily. Patient not taking: Reported on 08/16/2017 06/21/17   Wurst, [place], PA-C  naproxen (NAPROSYN) 375 MG tablet Take 1 tablet (375 mg total) by mouth 2 (two) times daily as needed for moderate pain. Patient not taking: Reported on 08/16/2017 06/21/17   Rennis Harding, PA-C    Family History Family History  Adopted: Yes  Problem Relation Age of Onset  . Cancer Mother        pseudotumor in brain    Social History Social History   Tobacco Use  . Smoking status: Never Smoker  . Smokeless tobacco: Never Used  Substance Use Topics  . Alcohol use: Yes    Alcohol/week: 0.6 oz    Types: 1 Glasses of wine per week    Comment: 08/01/17 was last you use  . Drug use: Yes    Types: Marijuana    Comment: 08/01/17 was last use     Allergies   Patient  has no known allergies.   Review of Systems Review of Systems  Constitutional: Negative for chills and fever.  Gastrointestinal: Positive for abdominal pain, nausea and vomiting. Negative for abdominal distention, constipation and diarrhea.  Genitourinary: Positive for vaginal discharge. Negative for dysuria, frequency, urgency and vaginal bleeding.  Musculoskeletal: Negative for arthralgias and myalgias.  Skin: Negative for rash and wound.  Allergic/Immunologic: Negative for immunocompromised state.  Neurological: Negative for dizziness and weakness.  Hematological: Does not bruise/bleed easily.  Psychiatric/Behavioral: Negative for confusion.  All other systems reviewed and are negative.    Physical Exam Updated Vital Signs BP 113/64   Pulse 79   Temp 98.2 F (36.8 C) (Oral)   Resp 16   SpO2 100%   Physical Exam  Constitutional: She appears well-developed and well-nourished.  HENT:  Head: Normocephalic and atraumatic.  Eyes: No scleral icterus.  Cardiovascular: Normal rate and normal heart sounds.  No murmur heard. Pulmonary/Chest: Effort normal and breath sounds normal. No respiratory distress.  Abdominal: Soft. Normal appearance. There is tenderness in the right lower quadrant and suprapubic area. There is no CVA tenderness.  Genitourinary: Uterus normal. Cervix exhibits discharge. Cervix exhibits no motion tenderness and no friability. Right adnexum displays tenderness. Right adnexum displays no mass and no fullness. Left adnexum displays tenderness. Left adnexum displays no mass and no fullness. No erythema, tenderness or bleeding in the vagina. No signs of injury around the vagina. Vaginal discharge found.  Genitourinary Comments: Tech assisted with pelvic exam, mild diffuse pelvic tenderness, copious amount of light green discharge present.  Nursing note and vitals reviewed.    ED Treatments / Results  Labs (all labs ordered are listed, but only abnormal results  are displayed) Labs Reviewed  WET PREP, GENITAL - Abnormal; Notable for the following components:      Result Value   WBC, Wet Prep HPF POC FEW (*)    All other components within normal limits  COMPREHENSIVE METABOLIC PANEL - Abnormal; Notable for the following components:   Glucose, Bld 116 (*)    All other components within normal limits  URINALYSIS, ROUTINE W REFLEX MICROSCOPIC - Abnormal; Notable for the following components:   APPearance CLOUDY (*)    Hgb urine dipstick MODERATE (*)    Protein, ur 100 (*)    Leukocytes, UA MODERATE (*)    RBC / HPF >50 (*)    WBC, UA >50 (*)    Bacteria, UA MANY (*)    Non Squamous Epithelial 0-5 (*)    All other components within  normal limits  URINE CULTURE  LIPASE, BLOOD  CBC  I-STAT BETA HCG BLOOD, ED ([facility], [facility], [facility] ONLY)  GC/CHLAMYDIA PROBE AMP ([place]) NOT AT [facility]    EKG None  Radiology US Transvaginal Non-ob  Result Date: 08/16/2017 CLINICAL DATA:  Right lower quadrant pain for 2 weeks. EXAM: TRANSABDOMINAL AND TRANSVAGINAL ULTRASOUND OF PELVIS DOPPLER ULTRASOUND OF OVARIES TECHNIQUE: Both transabdominal and transvaginal ultrasound examinations of the pelvis were performed. Transabdominal technique was performed for global imaging of the pelvis including uterus, ovaries, adnexal regions, and pelvic cul-de-sac. It was necessary to proceed with endovaginal exam following the transabdominal exam to visualize the uterus, endometrium, and ovaries. Color and duplex Doppler ultrasound was utilized to evaluate blood flow to the ovaries. COMPARISON:  Pelvic ultrasound dated November 25, 2015. FINDINGS: Uterus Measurements: 7.2 x 4.4 x 5.0 cm. No fibroids or other mass visualized. Endometrium Thickness: 5 mm.  No focal abnormality visualized. Right ovary Measurements: 4.3 x 3.1 x 3.4 cm. Normal appearance/no adnexal mass. Dominant follicle. Left ovary Measurements: 2.1 x 1.2 x 1.7 cm. Normal appearance/no adnexal mass. Pulsed Doppler evaluation  of both ovaries demonstrates normal low-resistance arterial and venous waveforms. Other findings Small free fluid in the pelvis, likely physiologic. IMPRESSION: 1. Normal pelvic ultrasound. Electronically Signed   By: Obie Dredge M.D.   On: 08/16/2017 15:01   US Pelvis Complete  Result Date: 08/16/2017 CLINICAL DATA:  Right lower quadrant pain for 2 weeks. EXAM: TRANSABDOMINAL AND TRANSVAGINAL ULTRASOUND OF PELVIS DOPPLER ULTRASOUND OF OVARIES TECHNIQUE: Both transabdominal and transvaginal ultrasound examinations of the pelvis were performed. Transabdominal technique was performed for global imaging of the pelvis including uterus, ovaries, adnexal regions, and pelvic cul-de-sac. It was necessary to proceed with endovaginal exam following the transabdominal exam to visualize the uterus, endometrium, and ovaries. Color and duplex Doppler ultrasound was utilized to evaluate blood flow to the ovaries. COMPARISON:  Pelvic ultrasound dated November 25, 2015. FINDINGS: Uterus Measurements: 7.2 x 4.4 x 5.0 cm. No fibroids or other mass visualized. Endometrium Thickness: 5 mm.  No focal abnormality visualized. Right ovary Measurements: 4.3 x 3.1 x 3.4 cm. Normal appearance/no adnexal mass. Dominant follicle. Left ovary Measurements: 2.1 x 1.2 x 1.7 cm. Normal appearance/no adnexal mass. Pulsed Doppler evaluation of both ovaries demonstrates normal low-resistance arterial and venous waveforms. Other findings Small free fluid in the pelvis, likely physiologic. IMPRESSION: 1. Normal pelvic ultrasound. Electronically Signed   By: Obie Dredge M.D.   On: 08/16/2017 15:01   [place] Art/ven Flow Abd Pelv Doppler  Result Date: 08/16/2017 CLINICAL DATA:  Right lower quadrant pain for 2 weeks. EXAM: TRANSABDOMINAL AND TRANSVAGINAL ULTRASOUND OF PELVIS DOPPLER ULTRASOUND OF OVARIES TECHNIQUE: Both transabdominal and transvaginal ultrasound examinations of the pelvis were performed. Transabdominal technique was performed for  global imaging of the pelvis including uterus, ovaries, adnexal regions, and pelvic cul-de-sac. It was necessary to proceed with endovaginal exam following the transabdominal exam to visualize the uterus, endometrium, and ovaries. Color and duplex Doppler ultrasound was utilized to evaluate blood flow to the ovaries. COMPARISON:  Pelvic ultrasound dated November 25, 2015. FINDINGS: Uterus Measurements: 7.2 x 4.4 x 5.0 cm. No fibroids or other mass visualized. Endometrium Thickness: 5 mm.  No focal abnormality visualized. Right ovary Measurements: 4.3 x 3.1 x 3.4 cm. Normal appearance/no adnexal mass. Dominant follicle. Left ovary Measurements: 2.1 x 1.2 x 1.7 cm. Normal appearance/no adnexal mass. Pulsed Doppler evaluation of both ovaries demonstrates normal low-resistance arterial and venous waveforms. Other findings Small free fluid  in the pelvis, likely physiologic. IMPRESSION: 1. Normal pelvic ultrasound. Electronically Signed   By: Obie Dredge M.D.   On: 08/16/2017 15:01    Procedures Procedures (including critical care time)  Medications Ordered in ED Medications - No data to display   Initial Impression / Assessment and Plan / ED Course  I have reviewed the triage vital signs and the nursing notes.  Pertinent labs & imaging results that were available during my care of the patient were reviewed by me and considered in my medical decision making (see chart for details).  Clinical Course as of Aug 17 1527  Mon Aug 16, 2017  1509 21yo female presents with complaint of abdominal pain x 1 month, believes she may be pregnant (nausea with smells, daily nausea and vomiting, states hasn't had a menstrual cycle x 2 years). Initial exam, RLQ and suprapubic tenderness. Moderate light green dc with mild pelvic tenderness. [place] normal- no torsion, cyst, TOA. CBC and CMP unremarkable. UA with moderate leukocytes without any urinary symptoms, consider contaminant from vaginal dc vs UTI- will tx with  Keflex due to leukocytes/protein/blood with 0-5 epithelial's. Denies possibility of STD, GC/Chlam[facility] done at [facility] earlier this month normal (at that visit patient reported vaginal bleeding). Repeat abdominal exam, mild tenderness right and left lower abdominal areas, no rebound tenderness, consider appendicitis however pain x 1 month and less likely to be acute appendicitis. Patient plans to follow up with her PCP, advised her to return to the ER for worsening pain, fevers, vomiting or any other concerning symptoms. Declines treatment with Zithromax and rocephin today for her vaginal discharge.    [LM]    Clinical Course User Index [LM] Jeannie Fend, PA-C     Final Clinical Impressions(s) / ED Diagnoses   Final diagnoses:  Pelvic pain in female  Lower urinary tract infectious disease    ED Discharge Orders        Ordered    cephALEXin (KEFLEX) 500 MG capsule  3 times daily     08/16/17 1526       Jeannie Fend, PA-C 08/16/17 1529    Derwood Kaplan, MD 08/18/17 1515

## 2017-08-17 LAB — GC/CHLAMYDIA PROBE AMP (~~LOC~~) NOT AT ARMC
Chlamydia: NEGATIVE
Neisseria Gonorrhea: NEGATIVE

## 2017-08-19 LAB — URINE CULTURE
Culture: >=100000 — AB
Special Requests: NORMAL

## 2017-08-20 ENCOUNTER — Telehealth: Payer: Self-pay

## 2017-08-20 NOTE — Telephone Encounter (Signed)
Post ED Visit - Positive Culture Follow-up  Culture report reviewed by antimicrobial stewardship pharmacist:  []  Amanda [name], Pharm.D. []  Amanda [name], Pharm.D., BCPS AQ-ID []  Amanda [name], Pharm.D., BCPS []  Amanda [name], Pharm.D., BCPS []  Amanda [name], Vermont[place]arm.D., BCPS, AAHIVP []  Amanda [name], Pharm.D., BCPS, AAHIVP []  Amanda [name], PharmD, BCPS []  Amanda [name], PharmD []  Amanda [name], PharmD, BCPS Amanda [name], Amanda [name] Pharm D Positive urine culture Treated with Cephalexin, organism sensitive to the same and no further patient follow-up is required at this time.  Amanda [name], Amanda [name] 08/20/2017, 9:20 AM

## 2017-12-28 ENCOUNTER — Ambulatory Visit: Payer: Medicaid Other | Admitting: Advanced Practice Midwife

## 2017-12-28 NOTE — Progress Notes (Deleted)
  GYNECOLOGY PROGRESS NOTE  History:  22 y.o. [id number] presents to [facility] office today for contraceptive counseling visit. She reports *****.  She denies h/a, dizziness, shortness of breath, n/v, or fever/chills.    The following portions of the patient's history were reviewed and updated as appropriate: allergies, current medications, past family history, past medical history, past social history, past surgical history and problem list. Last pap smear on *** was normal, *** HRHPV.  Review of Systems:  Pertinent items are noted in HPI.   Objective:  Physical Exam There were no vitals taken for this visit. VS reviewed, nursing note reviewed,  Constitutional: well developed, well nourished, no distress HEENT: normocephalic CV: normal rate Pulm/chest wall: normal effort Breast Exam: deferred Abdomen: soft Neuro: alert and oriented x 3 Skin: warm, dry Psych: affect normal Pelvic exam: Cervix pink, visually closed, without lesion, scant white creamy discharge, vaginal walls and external genitalia normal Bimanual exam: Cervix 0/long/high, firm, anterior, neg CMT, uterus nontender, nonenlarged, adnexa without tenderness, enlargement, or mass  Assessment & Plan:  1. Encounter for counseling regarding contraception ***   Sharen Counter, CNM 8:40 AM

## 2019-03-19 ENCOUNTER — Emergency Department (HOSPITAL_COMMUNITY): Payer: Medicaid Other

## 2019-03-19 ENCOUNTER — Other Ambulatory Visit: Payer: Self-pay

## 2019-03-19 ENCOUNTER — Encounter (HOSPITAL_COMMUNITY): Payer: Self-pay | Admitting: *Deleted

## 2019-03-19 ENCOUNTER — Emergency Department (HOSPITAL_COMMUNITY)
Admission: EM | Admit: 2019-03-19 | Discharge: 2019-03-19 | Disposition: A | Discharge status: Home / Self Care | Payer: Medicaid Other | Attending: Emergency Medicine | Admitting: Emergency Medicine

## 2019-03-19 DIAGNOSIS — Y929 Unspecified place or not applicable: Secondary | ICD-10-CM | POA: Insufficient documentation

## 2019-03-19 DIAGNOSIS — X58XXXA Exposure to other specified factors, initial encounter: Secondary | ICD-10-CM | POA: Insufficient documentation

## 2019-03-19 DIAGNOSIS — M24412 Recurrent dislocation, left shoulder: Secondary | ICD-10-CM | POA: Insufficient documentation

## 2019-03-19 DIAGNOSIS — M25512 Pain in left shoulder: Secondary | ICD-10-CM

## 2019-03-19 DIAGNOSIS — S43005A Unspecified dislocation of left shoulder joint, initial encounter: Secondary | ICD-10-CM | POA: Diagnosis not present

## 2019-03-19 DIAGNOSIS — R0602 Shortness of breath: Secondary | ICD-10-CM | POA: Insufficient documentation

## 2019-03-19 DIAGNOSIS — Y9389 Activity, other specified: Secondary | ICD-10-CM | POA: Diagnosis not present

## 2019-03-19 DIAGNOSIS — S4992XA Unspecified injury of left shoulder and upper arm, initial encounter: Secondary | ICD-10-CM | POA: Diagnosis present

## 2019-03-19 DIAGNOSIS — Y999 Unspecified external cause status: Secondary | ICD-10-CM | POA: Insufficient documentation

## 2019-03-19 NOTE — ED Triage Notes (Signed)
THE PT IS C/O LT SHOULDER PAIN  HER LT SHOULDER POPPED OUT OF JOINT APPROX 40 MINUTES AGO  AS SHE WAS REACHING FOR A BOTTLE OF WATER  SHE GOT IT TO POP BACK IN.  SHE HAS A HX OF BOTH SHOULDERS DISLOCATING  SHE IS PRESENTLY ABLE TO MOVE HER SHOULDER JUST PAINFUL  LMP  JAN 1ST

## 2019-03-19 NOTE — Discharge Instructions (Signed)
Your history today is consistent with a shoulder dislocation that you were able to get back in place.  It does look like you may have injured the scapula slightly called a possible [place] injury.  Please keep the sling in place and follow-up with orthopedic surgery for further evaluation and management.  Please rest and stay hydrated.

## 2019-03-19 NOTE — ED Notes (Signed)
Ortho tech paged for shoulder sling

## 2019-03-19 NOTE — Progress Notes (Signed)
Orthopedic Tech Progress Note Patient Details:  Amanda [name] Aug 22, 1995 [id number]  Ortho Devices Type of Ortho Device: Shoulder immobilizer Ortho Device/Splint Location: LUE Ortho Device/Splint Interventions: Application, Ordered   Post Interventions Patient Tolerated: Well Instructions Provided: Poper ambulation with device, Care of device, Adjustment of device   Donald Pore 03/19/2019, 8:20 PM

## 2019-03-19 NOTE — ED Notes (Signed)
Pt verbalized understanding of discharge instructions. Follow up care reviewed, pt had no further questions. 

## 2019-03-19 NOTE — ED Provider Notes (Signed)
[facility] EMERGENCY DEPARTMENT Provider Note   CSN: [id number] Arrival date & time: 03/19/19  1743     History Chief Complaint  Patient presents with  . Shoulder Pain    Elaysia Koren is a 24 y.o. female.  The history is provided by medical records and the patient. No language interpreter was used.  Shoulder Pain Location:  Shoulder Shoulder location:  L shoulder Injury: no   Pain details:    Quality:  Sharp   Radiates to:  Does not radiate   Severity:  Severe   Onset quality:  Sudden   Duration:  1 hour   Timing:  Constant   Progression:  Improving Dislocation: yes   Foreign body present:  Unable to specify Worsened by:  Movement Ineffective treatments:  None tried Associated symptoms: no back pain, no fatigue and no fever        Past Medical History:  Diagnosis Date  . Hx of chlamydia infection 05/04/14   treated, TOC-neg  . Hx of gonorrhea 05/04/14   treated, TOC-neg  . Hx of wisdom tooth extraction     Patient Active Problem List   Diagnosis Date Noted  . Fall 10/30/2015    Past Surgical History:  Procedure Laterality Date  . WISDOM TOOTH EXTRACTION       OB History    Gravida  2   Para  2   Term  2   Preterm  0   AB  0   Living  2     SAB  0   TAB  0   Ectopic  0   Multiple      Live Births  2           Family History  Adopted: Yes  Problem Relation Age of Onset  . Cancer Mother        pseudotumor in brain    Social History   Tobacco Use  . Smoking status: Never Smoker  . Smokeless tobacco: Never Used  Substance Use Topics  . Alcohol use: Yes    Alcohol/week: 1.0 standard drinks    Types: 1 Glasses of wine per week    Comment: 08/01/17 was last you use  . Drug use: Yes    Types: Marijuana    Comment: 08/01/17 was last use    Home Medications Prior to Admission medications   Medication Sig Start Date End Date Taking? Authorizing Provider  acetaminophen (TYLENOL) 500 MG tablet Take 1,000 mg  by mouth every 6 (six) hours as needed for mild pain, moderate pain or headache.     [provider]  diclofenac (VOLTAREN) 75 MG EC tablet Take 1 tablet (75 mg total) 2 (two) times daily by mouth. Take with food Patient not taking: Reported on 01/25/2017 01/06/17   Domenick Gong, MD  ibuprofen (ADVIL,MOTRIN) 200 MG tablet Take 800 mg by mouth every 6 (six) hours as needed.    [provider]  medroxyPROGESTERone (DEPO-PROVERA) 150 MG/ML injection Inject 1 mL (150 mg total) into the muscle every 3 (three) months. Patient not taking: Reported on 08/16/2017 09/29/16 06/27/17  Judeth Horn, NP  methocarbamol (ROBAXIN) 750 MG tablet Take 1 tablet (750 mg total) every 4 (four) hours by mouth. Patient not taking: Reported on 08/16/2017 01/06/17   Domenick Gong, MD  mupirocin cream (BACTROBAN) 2 % Apply 1 application topically daily. Patient not taking: Reported on 08/16/2017 06/21/17   Wurst, [place], PA-C  naproxen (NAPROSYN) 375 MG tablet Take 1 tablet (  375 mg total) by mouth 2 (two) times daily as needed for moderate pain. Patient not taking: Reported on 08/16/2017 06/21/17   Lestine Box, PA-C    Allergies    Patient has no known allergies.  Review of Systems   Review of Systems  Constitutional: Negative for chills, fatigue and fever.  HENT: Negative for congestion.   Respiratory: Positive for shortness of breath (resolved now). Negative for cough, chest tightness and wheezing.   Cardiovascular: Negative for chest pain.  Gastrointestinal: Negative for nausea and vomiting.  Genitourinary: Negative for flank pain.  Musculoskeletal: Negative for back pain.  Neurological: Negative for weakness, light-headedness, numbness and headaches.  Psychiatric/Behavioral: Negative for agitation.  All other systems reviewed and are negative.   Physical Exam Updated Vital Signs BP 106/82 (BP Location: Right Arm)   Pulse 81   Temp 99.1 F (37.3 C) (Oral)   Resp 18   Ht 5\' 3"   (1.6 m)   Wt 76.7 kg   LMP 03/05/2019   SpO2 99%   BMI 29.94 kg/m   Physical Exam Vitals and nursing note reviewed.  Constitutional:      General: She is not in acute distress.    Appearance: She is well-developed. She is not ill-appearing, toxic-appearing or diaphoretic.  HENT:     Head: Normocephalic and atraumatic.  Eyes:     Conjunctiva/sclera: Conjunctivae normal.  Cardiovascular:     Rate and Rhythm: Normal rate.     Heart sounds: No murmur.  Pulmonary:     Effort: No respiratory distress.     Breath sounds: No stridor.  Abdominal:     General: There is no distension.     Tenderness: There is no abdominal tenderness. There is no rebound.  Musculoskeletal:        General: Tenderness present. No deformity or signs of injury.     Left shoulder: Tenderness present.     Right lower leg: No edema.     Left lower leg: No edema.     Comments: Left shoulder tenderness.  Normal grip strength, sensation, and pulse in left arm.  No laceration.  Lungs clear and chest is nontender.  Skin:    General: Skin is warm.     Coloration: Skin is not pale.     Findings: No erythema or rash.  Neurological:     General: No focal deficit present.     Mental Status: She is alert and oriented to person, place, and time.     Sensory: No sensory deficit.     Motor: No weakness or abnormal muscle tone.     Deep Tendon Reflexes: Reflexes are normal and symmetric.     ED Results / Procedures / Treatments   Labs (all labs ordered are listed, but only abnormal results are displayed) Labs Reviewed - No data to display  EKG EKG Interpretation  Date/Time:  Sunday March 19 2019 18:07:45 EST Ventricular Rate:  83 PR Interval:  154 QRS Duration: 78 QT Interval:  330 QTC Calculation: 387 R Axis:   29 Text Interpretation: Normal sinus rhythm Nonspecific T wave abnormality Abnormal ECG No prior ECG for comparison. No STEMI Confirmed by Antony Blackbird [phone]) on 03/19/2019 6:45:07  PM   Radiology DG Shoulder Left  Result Date: 03/19/2019 CLINICAL DATA:  Shoulder dislocation, now relocated EXAM: LEFT SHOULDER - 2+ VIEW COMPARISON:  None. FINDINGS: Humeral head is normally seated within the glenoid. Acromioclavicular alignment is maintained. There is a crescentic fragment along the anterior inferior aspect of  the glenoid worrisome for a bony Bankart type injury in the setting of reported dislocation. No clear Hill-Sachs lesion is evident. Overlying soft tissue swelling is present. Included portion of the left chest wall and lung are clear. IMPRESSION: Crescentic fragment along the anteroinferior aspect of the glenoid worrisome for a bony Bankart type injury in the setting of reported dislocation. Humeral head normally located at this time. Soft tissue swelling is present. Electronically Signed   By: Kreg Shropshire M.D.   On: 03/19/2019 18:56    Procedures Procedures (including critical care time)  Medications Ordered in ED Medications - No data to display  ED Course  I have reviewed the triage vital signs and the nursing notes.  Pertinent labs & imaging results that were available during my care of the patient were reviewed by me and considered in my medical decision making (see chart for details).    MDM Rules/Calculators/A&P                      Kylin Furth is a 24 y.o. female with a past medical history significant for prior STI and recurrent shoulder dislocations who presents with shoulder dislocation.  Patient reports that she has had approximately 6 dislocations in each shoulder in the past most recently several months ago.  She says that she was using her left arm to reach for a drink on a different table when she felt it pop out.  It was down to her side and she could not move it and had severe pain.  She reports that she tried to force it back into position and thinks she heard a clunk and it got back into position.  Due to the pain, she presented to make sure  it was in correct position and not dislocated and make sure there is no other abnormality.  She reports no trauma to it.  She denies any numbness, difficulty with grip strength, or other abnormalities.  No chest pain, palpitations, nausea, vomiting, or other complaints.  She did have some mild shortness of breath when her pain was severe.  That has completely resolved.  On exam, patient has some mild tenderness in the left shoulder.  Good grip strength, sensation, and pulse in the arm.  Normal elbow range of motion with no tenderness.  Patient is able to take her left arm and touch her right shoulder.  I doubt it is dislocated based on exam.  Exam otherwise unremarkable.  X-ray confirmed it is not dislocated at this time however it does look like she may have a Bankart injury on the labrum on the left side.  Patient placed into a sling and will follow up with orthopedics for further management.  She is agreeable to this plan and will take over-the-counter pain medications.  She had no depressions or concerns and was discharged in good condition.    Final Clinical Impression(s) / ED Diagnoses Final diagnoses:  Closed dislocation of left shoulder, initial encounter  Acute pain of left shoulder     Clinical Impression: 1. Closed dislocation of left shoulder, initial encounter   2. Acute pain of left shoulder     Disposition: Discharge  Condition: Good  I have discussed the results, Dx and Tx plan with the pt(& family if present). He/she/they expressed understanding and agree(s) with the plan. Discharge instructions discussed at great length. Strict return precautions discussed and pt &/or family have verbalized understanding of the instructions. No further questions at time of discharge.  New Prescriptions   No medications on file    Follow Up: Bjorn Pippin, MD [address] [place] [address] [phone]  Schedule an appointment as soon as possible for a visit   with orthopedics     Tarris Delbene, Canary Brim, MD 03/19/19 2033

## 2019-12-26 ENCOUNTER — Ambulatory Visit: Payer: Medicaid Other

## 2020-01-09 ENCOUNTER — Ambulatory Visit: Payer: Medicaid Other | Attending: Nurse Practitioner | Admitting: Physical Therapy

## 2020-01-09 ENCOUNTER — Other Ambulatory Visit: Payer: Self-pay

## 2020-01-09 ENCOUNTER — Encounter: Payer: Self-pay | Admitting: Physical Therapy

## 2020-01-09 DIAGNOSIS — M6281 Muscle weakness (generalized): Secondary | ICD-10-CM | POA: Insufficient documentation

## 2020-01-09 DIAGNOSIS — M24412 Recurrent dislocation, left shoulder: Secondary | ICD-10-CM | POA: Insufficient documentation

## 2020-01-09 NOTE — Therapy (Signed)
[facility] [address] [place], [place], [address] Phone: [phone]   Fax:  [phone]  Physical Therapy Evaluation  Patient Details  Name: Amanda [name] MRN: [id number] Date of Birth: 08/25/1995 Referring Provider (PT): Elizabeth Palau, NP   Encounter Date: 01/09/2020   PT End of Session - 01/09/20 2050    Visit Number 1    Number of Visits 4    Date for PT Re-Evaluation 02/09/20    Authorization Type MCS-requesting initial 3 treatment visits    PT Start Time 1717    PT Stop Time 1752    PT Time Calculation (min) 35 min    Activity Tolerance --   limited tolerance shoulder AROM   Behavior During Therapy [facility] for tasks assessed/performed           Past Medical History:  Diagnosis Date   Hx of chlamydia infection 05/04/14   treated, TOC-neg   Hx of gonorrhea 05/04/14   treated, TOC-neg   Hx of wisdom tooth extraction     Past Surgical History:  Procedure Laterality Date   WISDOM TOOTH EXTRACTION      There were no vitals filed for this visit.    Subjective Assessment - 01/09/20 1808    Subjective Pt. 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 She reports initial episode was 3-4 years ago-she does report history of trauma with a fall on outstretched hands prior to the onset of dislocations but initial onset was not associated with a trauma at time of dislocation. She went to ED 03/19/19 secondary to dislocation which she had been able to reduce prior to X-rays being taken. X-rays (-) for fracture-MD notes report per radiology report possible bony Bankart lesion. She reports symptoms on both sides but left side has been worse with multiple episodes of shoulder subluxationas vs. dislocations occuring with reaching activities. Last instance of subluxation (occured on left side) was about a month ago. Plan is to try conservative  tx. first to see if symptoms can be addressed before considering surgical intervention.    Pertinent History 3-4 year history bilateral left>righ shoulder subluxations vs. dislocations    Limitations House hold activities;Lifting    Diagnostic tests X-rays    Patient Stated Goals Get shoulders to stop dislocating    Currently in Pain? No/denies              [facility] PT Assessment - 01/09/20 0001      Assessment   Medical Diagnosis Recurrent dislocations of left shoulder    Referring Provider (PT) Elizabeth Palau, NP    Onset Date/Surgical Date 03/19/19   see subjective   Hand Dominance Right    Prior Therapy none      Precautions   Precaution Comments Recurrent left>right shoulder dislocations, avoid end-range abduction and ER       Restrictions   Weight Bearing Restrictions No      Balance Screen   Has the patient fallen in the past 6 months No      Prior Function   Level of Independence Independent with basic ADLs      Cognition   Overall Cognitive Status Within Functional Limits for tasks assessed      Observation/Other Assessments   Focus on Therapeutic Outcomes (FOTO)  --   not assessed due to [facility]     Posture/Postural Control   Posture/Postural Control Postural limitations    Postural Limitations Rounded Shoulders  ROM / Strength   AROM / PROM / Strength AROM;PROM;Strength      AROM   Overall AROM Comments Guarding with shoulder AROM, held abduction assessment due to apprehension    AROM Assessment Site Shoulder    Right/Left Shoulder Right;Left    Right Shoulder Flexion 175 Degrees    Right Shoulder Internal Rotation 80 Degrees    Right Shoulder External Rotation 40 Degrees   assessed at 0 deg abduction   Left Shoulder Flexion 160 Degrees    Left Shoulder Internal Rotation 80 Degrees    Left Shoulder External Rotation 45 Degrees   assessed at 0 deg abduction     PROM   Overall PROM Comments held assessment of end-range PROM due to pt. apprehension  and recent history multiple dislocations    PROM Assessment Site Shoulder    Right/Left Shoulder Left      Strength   Strength Assessment Site Shoulder;Elbow    Right/Left Shoulder Right;Left    Right Shoulder Flexion 4+/5    Right Shoulder Internal Rotation 5/5    Right Shoulder External Rotation 4+/5    Left Shoulder Flexion 4+/5    Left Shoulder Internal Rotation 5/5    Left Shoulder External Rotation 4+/5      Special Tests   Other special tests not assessed due to apprehension and guarding with shoulder ROM                      Objective measurements completed on examination: See above findings.       [facility] Adult PT Treatment/Exercise - 01/09/20 0001      Exercises   Exercises --   HEP handout review with brief practice-see chart copy                 PT Education - 01/09/20 2049    Education Details shoulder anatomy with symptom etiology and strategy for therapy, HEP, POC    Person(s) Educated Patient    Methods Explanation;Demonstration;Verbal cues;Handout    Comprehension Returned demonstration;Verbalized understanding               PT Long Term Goals - 01/09/20 2057      PT LONG TERM GOAL #1   Title Independent with initial HEP    Time 4    Period Weeks    Status New    Target Date 02/09/20      PT LONG TERM GOAL #2   Title Pt. to report no further instances of shoulder dislocation during initial 4 weeks of therapy plan of care    Time 4    Period Weeks    Status New    Target Date 02/09/20      PT LONG TERM GOAL #3   Title Increase left shoulder ER strength to 5/5 to help improve shoulder stability to decrease instances of dislocation    Baseline 4+/5    Time 4    Period Weeks    Status New    Target Date 02/09/20                  Plan - 01/09/20 2051    Clinical Impression Statement Pt. presents with left>right shoulder instability with history of recurrent dislocations with rotator cuff and periscapular  weakness. Apprehension with shoulder ROM for fear of dislocation thus limited ROM assessment. Plan trial PT to work on shoulder stabilization and strengthening to try and resolve issues with recurrent shoulder dislocations and associated functional limitations.  Personal Factors and Comorbidities Time since onset of injury/illness/exacerbation   recurrent dislocations   Examination-Activity Limitations Bathing;Dressing;Hygiene/Grooming;Lift;Reach Overhead    Examination-Participation Restrictions Cleaning    Stability/Clinical Decision Making Evolving/Moderate complexity    Clinical Decision Making Moderate    Rehab Potential Fair    PT Frequency --   1-2x/week   PT Duration 4 weeks    PT Treatment/Interventions ADLs/Self Care Home Management;Cryotherapy;Electrical Stimulation;Moist Heat;Therapeutic exercise;Neuromuscular re-education;Therapeutic activities;Patient/family education;Manual techniques;Taping    PT Next Visit Plan Caution due to history of recurrent left shoulder dislocations-avoid end-range abduction and ER, review HEP as needed, work on shoulder stability with rotator cuff and periscapular stability and strengthening, isometrics, update HEP as tolerated    PT Home Exercise Plan Access code: [email or web address]    Consulted and Agree with Plan of Care Patient           Patient will benefit from skilled therapeutic intervention in order to improve the following deficits and impairments:  Impaired UE functional use, Decreased strength  Visit Diagnosis: Recurrent dislocation of left shoulder  Muscle weakness (generalized)     Problem List Patient Active Problem List   Diagnosis Date Noted   Fall 10/30/2015    Lazarus Gowda, PT, DPT 01/09/20 9:00 PM  [facility] [address] [place], [place], [address] Phone: [phone]   Fax:  [phone]  Name: Amanda [name] MRN: [id number] Date of Birth: 06-15-1995

## 2020-01-15 ENCOUNTER — Other Ambulatory Visit: Payer: Self-pay

## 2020-01-15 ENCOUNTER — Encounter: Payer: Self-pay | Admitting: Certified Nurse Midwife

## 2020-01-15 ENCOUNTER — Other Ambulatory Visit (HOSPITAL_COMMUNITY)
Admission: RE | Admit: 2020-01-15 | Discharge: 2020-01-15 | Disposition: A | Discharge status: Home / Self Care | Payer: Medicaid Other | Source: Ambulatory Visit | Attending: Certified Nurse Midwife | Admitting: Certified Nurse Midwife

## 2020-01-15 ENCOUNTER — Ambulatory Visit (INDEPENDENT_AMBULATORY_CARE_PROVIDER_SITE_OTHER): Payer: Medicaid Other | Admitting: Certified Nurse Midwife

## 2020-01-15 VITALS — BP 105/71 | HR 76 | Wt 192.2 lb

## 2020-01-15 DIAGNOSIS — Z01419 Encounter for gynecological examination (general) (routine) without abnormal findings: Secondary | ICD-10-CM

## 2020-01-15 DIAGNOSIS — N760 Acute vaginitis: Secondary | ICD-10-CM

## 2020-01-15 DIAGNOSIS — Z113 Encounter for screening for infections with a predominantly sexual mode of transmission: Secondary | ICD-10-CM

## 2020-01-15 MED ORDER — BORIC ACID CRYS: 600.0000 mg | CRYSTALS | Freq: Every day | 5 refills | Status: DC

## 2020-01-15 NOTE — Progress Notes (Signed)
GYNECOLOGY ANNUAL PREVENTATIVE CARE ENCOUNTER NOTE  History:     Amanda [name] is a 24 y.o. G13P2002 female here for a new patient gynecologic exam and to establish care.  Current complaints: recurrent vaginitis.   Denies abnormal vaginal bleeding, pelvic pain, problems with intercourse or other gynecologic concerns.  Patient request repeat GC/C today.   Gynecologic History Patient's last menstrual period was 01/02/2020 (exact date). Contraception: none Last Pap: 10/2019. Results were: abnormal - LSIL. HPV reflex not performed at [facility]   Obstetric History OB History  Gravida Para Term Preterm AB Living  2 2 2  0 0 2  SAB TAB Ectopic Multiple Live Births  0 0 0   2    # Outcome Date GA Lbr Len/2nd Weight Sex Delivery Anes PTL Lv  2 Term 11/10/15 [redacted]w[redacted]d 04:20 / 00:14 6 lb 1.5 oz (2.764 kg) F Vag-Spont EPI  LIV  1 Term 06/23/14 [redacted]w[redacted]d 02:00 / 00:52 6 lb 14.1 oz (3.12 kg) M Vag-Spont EPI  LIV    Past Medical History:  Diagnosis Date  . Hx of chlamydia infection 05/04/14   treated, TOC-neg  . Hx of gonorrhea 05/04/14   treated, TOC-neg  . Hx of wisdom tooth extraction     Past Surgical History:  Procedure Laterality Date  . WISDOM TOOTH EXTRACTION      Current Outpatient Medications on File Prior to Visit  Medication Sig Dispense Refill  . acetaminophen (TYLENOL) 500 MG tablet Take 1,000 mg by mouth every 6 (six) hours as needed for mild pain, moderate pain or headache.     . clindamycin-benzoyl peroxide (BENZACLIN) gel Apply topically 2 (two) times daily.    07/04/14 doxycycline (VIBRA-TABS) 100 MG tablet Take 100 mg by mouth 2 (two) times daily.    Marland Kitchen ibuprofen (ADVIL) 800 MG tablet Take by mouth.    . naproxen (NAPROSYN) 375 MG tablet Take 1 tablet (375 mg total) by mouth 2 (two) times daily as needed for moderate pain. 30 tablet 0  . RETIN-A MICRO 0.04 % gel SMARTSIG:Sparingly Topical Every Night     No current facility-administered medications on file prior to visit.     Allergies  Allergen Reactions  . Bee Venom Rash    Social History:  reports that she has never smoked. She has never used smokeless tobacco. She reports current alcohol use of about 1.0 standard drink of alcohol per week. She reports current drug use. Drug: Marijuana.  Family History  Adopted: Yes  Problem Relation Age of Onset  . Cancer Mother        pseudotumor in brain    The following portions of the patient's history were reviewed and updated as appropriate: allergies, current medications, past family history, past medical history, past social history, past surgical history and problem list.  Review of Systems Pertinent items noted in HPI and remainder of comprehensive ROS otherwise negative.  Physical Exam:  BP 105/71   Pulse 76   Wt 192 lb 3.2 oz (87.2 kg)   LMP 01/02/2020 (Exact Date)   BMI 34.05 kg/m  CONSTITUTIONAL: Well-developed, well-nourished female in no acute distress.  HENT:  Normocephalic, atraumatic, External right and left ear normal. Oropharynx is clear and moist EYES: Conjunctivae and EOM are normal. Pupils are equal, round, and reactive to light.  NECK: Normal range of motion, supple, no masses.  Normal thyroid.  SKIN: Skin is warm and dry. No rash noted. Not diaphoretic. No erythema. No pallor. MUSCULOSKELETAL: Normal range of motion. No tenderness.  No cyanosis, clubbing, or edema.  2+ distal pulses. NEUROLOGIC: Alert and oriented to person, place, and time. Normal reflexes, muscle tone coordination.  PSYCHIATRIC: Normal mood and affect. Normal behavior. Normal judgment and thought content. CARDIOVASCULAR: Normal heart rate noted, regular rhythm RESPIRATORY: Clear to auscultation bilaterally. Effort and breath sounds normal, no problems with respiration noted. ABDOMEN: Soft, no distention noted.  No tenderness, rebound or guarding.    Assessment and Plan:    1. Screen for STD (sexually transmitted disease) - patient request repeat STD screening  due to having a new partner, patient request to perform self swab  - Declines repeat HIV and RPR, but wants Hep C and B since it was never performed  - Hepatitis C Antibody - Hepatitis B Surface AntiGEN - Cervicovaginal ancillary only( [place])  2. Women's annual routine gynecological examination - normal well woman examination  - reviewed abnormal pap from [facility] on 10/12/19- discussed with patient recommendations of repeat pap in 1 year, patient verbalizes understanding   3. Recurrent vaginitis - educated and discussed alternative therapies for vaginitis  - encouraged probiotics and sensitive soaps in addition to unscented pads/panty liners  - Boric Acid CRYS; Place 600 mg vaginally at bedtime. Use vaginally every night for two weeks then twice a week  Dispense: 500 g; Refill: 5   Routine preventative health maintenance measures emphasized. Please refer to After Visit Summary for other counseling recommendations.      Sharyon Cable, CNM [facility] [employer], [facility]

## 2020-01-15 NOTE — Patient Instructions (Signed)
Alternative Vaginitis Therapies  1) soak in tub of warm water waist high with 1/2 cup of baking soda in water for ~ 20 mins. 2) soak 3 tampons in 1 tablespoon of fractionated (liquid form) coconut oil with 10 drops of Melaleuca (Tea Tree) essential oil, insert 1 saturated tampon vaginally at bedtime x 3 days. Both options are to be done after sexual intercourse, menses and when suspects BV and/or yeast infection. Advised that these alternatives will not replace the need to be evaluated, if sx's persist. You will need to seek care at an OB/GYN provider.  GO WHITE: Soap: UNSCENTED Dove (white box light green writing) Laundry detergent (underwear)- Dreft or [employer] unscented WHITE 100% cotton panties (NOT just cotton crouch) Sanitary napkin/panty liners: UNSCENTED.  If it doesn't SAY unscented it can have a scent/perfume    NO PERFUMES OR LOTIONS OR POTIONS in the vulvar area (may use regular [place]) Condoms: hypoallergenic only. Non dyed (no color) Toilet papers: white only Wash clothes: use a separate wash cloth. WHITE.  Wash in Dreft.   You can purchase Tea Tree Oil locally at:  [employer] [address] [place], [place] [address] [phone]  [employer] [address] [place], [place] [address] [phone]  [employer] [address] [place], [place] [address] [phone] 

## 2020-01-16 LAB — CERVICOVAGINAL ANCILLARY ONLY
Bacterial Vaginitis (gardnerella): NEGATIVE
Candida Glabrata: NEGATIVE
Candida Vaginitis: NEGATIVE
Chlamydia: NEGATIVE
Comment: NEGATIVE
Comment: NEGATIVE
Comment: NEGATIVE
Comment: NEGATIVE
Comment: NEGATIVE
Comment: NORMAL
Neisseria Gonorrhea: NEGATIVE
Trichomonas: NEGATIVE

## 2020-01-16 LAB — HEPATITIS C ANTIBODY: Hep C Virus Ab: <0.1 s/co ratio (ref 0.0–0.9)

## 2020-01-16 LAB — HEPATITIS B SURFACE ANTIGEN: Hepatitis B Surface Ag: NEGATIVE

## 2020-01-29 ENCOUNTER — Telehealth: Payer: Self-pay

## 2020-01-29 ENCOUNTER — Ambulatory Visit: Payer: Medicaid Other

## 2020-01-29 NOTE — Telephone Encounter (Signed)
PT contacted patient via phone call regarding "no show" this afternoon. Patient apologetically reports being busy and states she completely forgot but will be at her next appointment.  Rhea Bleacher, PT, DPT 01/29/20 12:37 PM

## 2020-01-31 ENCOUNTER — Ambulatory Visit: Payer: Medicaid Other

## 2020-02-05 ENCOUNTER — Ambulatory Visit: Payer: Medicaid Other

## 2020-02-05 ENCOUNTER — Other Ambulatory Visit: Payer: Self-pay

## 2020-02-06 ENCOUNTER — Ambulatory Visit: Payer: Medicaid Other | Attending: Nurse Practitioner | Admitting: Physical Therapy

## 2020-02-06 ENCOUNTER — Encounter: Payer: Self-pay | Admitting: Physical Therapy

## 2020-02-06 DIAGNOSIS — M6281 Muscle weakness (generalized): Secondary | ICD-10-CM | POA: Insufficient documentation

## 2020-02-06 DIAGNOSIS — M24412 Recurrent dislocation, left shoulder: Secondary | ICD-10-CM | POA: Insufficient documentation

## 2020-02-06 NOTE — Therapy (Signed)
[facility] [address] [place], [place], [address] Phone: [phone]   Fax:  [phone]  Physical Therapy Treatment  Patient Details  Name: Rayah Fines MRN: [id number] Date of Birth: 10/04/1995 Referring Provider (PT): Elizabeth Palau, NP   Encounter Date: 02/06/2020   PT End of Session - 02/06/20 1340    Visit Number 2    Number of Visits 4    Date for PT Re-Evaluation 02/09/20    Authorization Type MCS-requesting initial 3 treatment visits    Authorization Time Period CCME: 01/26/20-02/15/20    Authorization - Visit Number 1    Authorization - Number of Visits 3    PT Start Time 1319    PT Stop Time 1400    PT Time Calculation (min) 41 min           Past Medical History:  Diagnosis Date  . Hx of chlamydia infection 05/04/14   treated, TOC-neg  . Hx of gonorrhea 05/04/14   treated, TOC-neg  . Hx of wisdom tooth extraction     Past Surgical History:  Procedure Laterality Date  . WISDOM TOOTH EXTRACTION      There were no vitals filed for this visit.   Subjective Assessment - 02/06/20 1320    Subjective Pt reports no change. She reports arm almost popped out of socket while trying to put her clothes. Pt reports no pain but her shoulders feel weak.    Currently in Pain? No/denies              [facility] PT Assessment - 02/06/20 0001      AROM   Right Shoulder Internal Rotation 80 Degrees    Right Shoulder External Rotation 40 Degrees   assessed at 0 deg abduction   Left Shoulder Internal Rotation 80 Degrees    Left Shoulder External Rotation 45 Degrees   assessed at 0 deg abduction     Strength   Right Shoulder Flexion 4+/5    Right Shoulder Internal Rotation 5/5    Right Shoulder External Rotation 4+/5    Left Shoulder Flexion 4+/5    Left Shoulder Internal Rotation 5/5    Left Shoulder External Rotation 4+/5                         [facility] Adult PT Treatment/Exercise - 02/06/20 0001        Shoulder Exercises: Standing   External Rotation Right;Left;10 reps    Theraband Level (Shoulder External Rotation) Level 2 (Red)    External Rotation Limitations fatigues quickly    Theraband Level (Shoulder Internal Rotation) Level 3 (Green)    Internal Rotation Limitations x 30    Theraband Level (Shoulder Extension) Level 3 (Green)    Extension Limitations x30    Theraband Level (Shoulder Row) Level 3 (Green)    Row Limitations x30      Shoulder Exercises: Isometric Strengthening   Other Isometric Exercises flexion, abduction, extension, IR,ER 10 sec x 5                        PT Long Term Goals - 02/06/20 1327      PT LONG TERM GOAL #1   Title Independent with initial HEP    Baseline 3 x per week currently    Time 4    Period Weeks    Status On-going      PT LONG TERM GOAL #2   Title  Pt. to report no further instances of shoulder dislocation during initial 4 weeks of therapy plan of care    Baseline no dislocations since last visit    Time 4    Period Weeks    Status Achieved      PT LONG TERM GOAL #3   Title Increase left shoulder ER strength to 5/5 to help improve shoulder stability to decrease instances of dislocation    Baseline 4+/5    Time 4    Period Weeks    Status On-going                 Plan - 02/06/20 1346    Clinical Impression Statement Pt arrives after missing appts due to childcare. She reports that she will be able to attend now that she has childcare.She was scheduled for 1 more appt in her current POC and then for re-evaluation next week. She reports compliance with HEP 3 x per week and has not had any sublux/dislocations since initial evaluation. She started going to the gym 1 month ago and is doing some exercises with arms but no overhead movements.    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 Fall 10/30/2015    Sherrie Mustache, PTA 02/06/2020, 2:12 PM  [facility] [address] [place], [place], [address] Phone: [phone]   Fax:  [phone]  Name: Willowdean Luhmann MRN: [id number] Date of Birth: Aug 25, 1995

## 2020-02-09 ENCOUNTER — Encounter: Payer: Self-pay | Admitting: Physical Therapy

## 2020-02-09 ENCOUNTER — Telehealth: Payer: Self-pay | Admitting: Physical Therapy

## 2020-02-09 NOTE — Telephone Encounter (Signed)
Spoke with patient regarding no show today. She reports she had a last minute change with kids and plans to attend next appointment. Ramie Erman C. Serra Younan PT, DPT 02/09/20 12:41 PM

## 2020-02-16 ENCOUNTER — Telehealth: Payer: Self-pay | Admitting: Physical Therapy

## 2020-02-16 ENCOUNTER — Ambulatory Visit: Payer: Medicaid Other | Admitting: Physical Therapy

## 2020-02-16 NOTE — Telephone Encounter (Signed)
Called and spoke with patient regarding no show for therapy appointment this AM. She reports having difficulties attending due to lack of child care. Informed of attendance policy with 2 consecutive no shows and multiple recent missed visits-she reports will contact clinic when able to get childcare to schedule further visits.

## 2020-03-20 ENCOUNTER — Other Ambulatory Visit (HOSPITAL_COMMUNITY)
Admission: RE | Admit: 2020-03-20 | Discharge: 2020-03-20 | Disposition: A | Discharge status: Home / Self Care | Payer: Medicaid Other | Source: Ambulatory Visit | Attending: Obstetrics & Gynecology | Admitting: Obstetrics & Gynecology

## 2020-03-20 ENCOUNTER — Ambulatory Visit (INDEPENDENT_AMBULATORY_CARE_PROVIDER_SITE_OTHER): Payer: Medicaid Other | Admitting: Obstetrics & Gynecology

## 2020-03-20 ENCOUNTER — Other Ambulatory Visit: Payer: Self-pay | Admitting: Obstetrics & Gynecology

## 2020-03-20 ENCOUNTER — Encounter: Payer: Self-pay | Admitting: Obstetrics & Gynecology

## 2020-03-20 ENCOUNTER — Other Ambulatory Visit: Payer: Self-pay

## 2020-03-20 VITALS — BP 110/69 | HR 83 | Ht 62.0 in | Wt 192.8 lb

## 2020-03-20 DIAGNOSIS — B373 Candidiasis of vulva and vagina: Secondary | ICD-10-CM

## 2020-03-20 DIAGNOSIS — N939 Abnormal uterine and vaginal bleeding, unspecified: Secondary | ICD-10-CM | POA: Diagnosis not present

## 2020-03-20 DIAGNOSIS — B379 Candidiasis, unspecified: Secondary | ICD-10-CM

## 2020-03-20 DIAGNOSIS — B3731 Acute candidiasis of vulva and vagina: Secondary | ICD-10-CM

## 2020-03-20 DIAGNOSIS — Z113 Encounter for screening for infections with a predominantly sexual mode of transmission: Secondary | ICD-10-CM | POA: Insufficient documentation

## 2020-03-20 NOTE — Progress Notes (Signed)
GYNECOLOGY OFFICE VISIT NOTE  History:   Amanda [name] is a 25 y.o. [id number] here today for evaluation of irregular bleeding for one cycle.  On review of her cycles using the Flo app, her cyle lengths are between 31-35 days, had one 44 days about 6 months ago.  Current cycle is 47 days, no period yet. Had negative home UPT. Reports having some cramping, feels like her period will come soon.  Patient also has increased weight gain, abnormal hair growth, acne and wants evaluation for PCOS. Also worried about pelvic pain, especially on her left side, desires ultrasound evaluation. Also desires vaginal discharge testing.   She denies any current abnormal vaginal discharge, bleeding or other concerns.    Past Medical History:  Diagnosis Date  . Hx of chlamydia infection 05/04/14   treated, TOC-neg  . Hx of gonorrhea 05/04/14   treated, TOC-neg  . Hx of wisdom tooth extraction     Past Surgical History:  Procedure Laterality Date  . WISDOM TOOTH EXTRACTION      The following portions of the patient's history were reviewed and updated as appropriate: allergies, current medications, past family history, past medical history, past social history, past surgical history and problem list.   Health Maintenance:  LGSIL pap on 10/16/2019, done at [facility]    Review of Systems:  Pertinent items noted in HPI and remainder of comprehensive ROS otherwise negative.  Physical Exam:  BP 110/69   Pulse 83   Ht 5\' 2"  (1.575 m)   Wt 192 lb 12.8 oz (87.5 kg)   LMP 02/03/2020 (Exact Date)   BMI 35.26 kg/m  CONSTITUTIONAL: Well-developed, well-nourished female in no acute distress.  HEENT:  Normocephalic, atraumatic. External right and left ear normal. No scleral icterus.  NECK: Normal range of motion, supple, no masses noted on observation SKIN: No rash noted. Not diaphoretic. No erythema. No pallor. MUSCULOSKELETAL: Normal range of motion. No edema noted. NEUROLOGIC: Alert and oriented to person, place,  and time. Normal muscle tone coordination. No cranial nerve deficit noted. PSYCHIATRIC: Normal mood and affect. Normal behavior. Normal judgment and thought content. CARDIOVASCULAR: Normal heart rate noted RESPIRATORY: Effort and breath sounds normal, no problems with respiration noted ABDOMEN: Mild LLQ tenderness to palpation.  No masses noted. No other overt distention noted.   PELVIC: Deferred     Assessment and Plan:      1. Abnormal uterine bleeding (AUB) Labs and ultrasound ordered, will follow up results and manage accordingly. Discussed that if she meets criteria for PCOS, weight loss is recommended. Also discussed OCPs, Metformin and Spironolactone as management options.  She does not want hormonal therapy at this point, reports gaining a lot of weight after Depo Provera.  Leaning towards Metformin and Spironolactone if she is diagnosed with PCOS. - CBC - DHEA-sulfate - Follicle stimulating hormone - Beta hCG quant (ref lab) - TSH+Prl+TestT+TestF+17OHP - Hemoglobin A1c - Ferritin - 14/06/2019 PELVIC COMPLETE WITH TRANSVAGINAL; Future - Cervicovaginal ancillary only( [place]) Routine preventative health maintenance measures emphasized. Please refer to After Visit Summary for other counseling recommendations.   Return for pap smear in 09/2020.    I spent 25 minutes dedicated to the care of this patient including pre-visit review of records, face to face time with the patient discussing her conditions and treatments and post visit ordering of testing.    10/2020, MD, FACOG Obstetrician & Gynecologist, [facility] for [facility]

## 2020-03-20 NOTE — Patient Instructions (Addendum)
You need pap smear in 09/2020 Concentrate research on Metformin and Spironolactone for treatment if diagnosed with PCOS Weight loss works better than anything else!   Polycystic Ovary Syndrome  Polycystic ovarian syndrome (PCOS) is a common hormonal disorder among women of reproductive age. In most women with PCOS, small fluid-filled sacs (cysts) grow on the ovaries. PCOS can cause problems with menstrual periods and make it hard to get and stay pregnant. If this condition is not treated, it can lead to serious health problems, such as diabetes and heart disease. What are the causes? The cause of this condition is not known. It may be due to certain factors, such as:  Irregular menstrual cycle.  High levels of certain hormones.  Problems with the hormone that helps to control blood sugar (insulin).  Certain genes. What increases the risk? You are more likely to develop this condition if you:  Have a family history of PCOS or type 2 diabetes.  Are overweight, eat unhealthy foods, and are not active. These factors may cause problems with blood sugar control, which can contribute to PCOS or PCOS symptoms. What are the signs or symptoms? Symptoms of this condition include:  Ovarian cysts and sometimes pelvic pain.  Menstrual periods that are not regular or are too heavy.  Inability to get or stay pregnant.  Increased growth of hair on the face, chest, stomach, back, thumbs, thighs, or toes.  Acne or oily skin. Acne may develop during adulthood, and it may not get better with treatment.  Weight gain or obesity.  Patches of thickened and dark brown or black skin on the neck, arms, breasts, or thighs. How is this diagnosed? This condition is diagnosed based on:  Your medical history.  A physical exam that includes a pelvic exam. Your health care provider may look for areas of increased hair growth on your skin.  Tests, such as: ? An ultrasound to check the ovaries for cysts  and to view the lining of the uterus. ? Blood tests to check levels of sugar (glucose), female hormone (testosterone), and female hormones (estrogen and progesterone). How is this treated? There is no cure for this condition, but treatment can help to manage symptoms and prevent more health problems from developing. Treatment varies depending on your symptoms and if you want to have a baby or if you need birth control. Treatment may include:  Making nutrition and lifestyle changes.  Taking the progesterone hormone to start a menstrual period.  Taking birth control pills to help you have regular menstrual periods.  Taking medicines such as: ? Medicines to make you ovulate, if you want to get pregnant. ? Medicine to reduce extra hair growth.  Having surgery in severe cases. This may involve making small holes in one or both of your ovaries. This decreases the amount of testosterone that your body makes. Follow these instructions at home:  Take over-the-counter and prescription medicines only as told by your health care provider.  Follow a healthy meal plan that includes lean proteins, complex carbohydrates, fresh fruits and vegetables, low-fat dairy products, healthy fats, and fiber.  If you are overweight, lose weight as told by your health care provider. Your health care provider can determine how much weight loss is best for you and can help you lose weight safely.  Keep all follow-up visits. This is important. Contact a health care provider if:  Your symptoms do not get better with medicine.  Your symptoms get worse or you develop new symptoms. Summary  Polycystic ovarian syndrome (PCOS) is a common hormonal disorder among women of reproductive age.  PCOS can cause problems with menstrual periods and make it hard to get and stay pregnant.  If this condition is not treated, it can lead to serious health problems, such as diabetes and heart disease.  There is no cure for this  condition, but treatment can help to manage symptoms and prevent more health problems from developing. This information is not intended to replace advice given to you by your health care provider. Make sure you discuss any questions you have with your health care provider. Document Revised: 07/27/2019 Document Reviewed: 07/27/2019 Elsevier Patient Education  2021 Elsevier Inc.     Acne  Acne is a skin problem that causes pimples and other skin changes. The skin has many tiny openings called pores. Each pore contains an oil gland. Oil glands make an oily substance that is called sebum. Acne occurs when the pores in the skin get blocked. The pores may become infected with bacteria, or they may become red, sore, and swollen. Acne is a common skin problem, especially for teenagers. It often occurs on the face, neck, chest, upper arms, and back. Acne usually goes away over time. What are the causes? Acne is caused when oil glands get blocked with sebum, dead skin cells, and dirt. The bacteria that are normally found in the oil glands then multiply and cause inflammation. Acne is commonly triggered by changes in your hormones. These hormonal changes can cause the oil glands to get bigger and to make more sebum. Factors that can make acne worse include:  Hormone changes during: ? Adolescence. ? Women's menstrual cycles. ? Pregnancy.  Oil-based cosmetics and hair products.  Stress.  Hormone problems that are caused by certain diseases.  Certain medicines.  Pressure from headbands, backpacks, or shoulder pads.  Exposure to certain oils and chemicals.  Eating a diet high in carbohydrates that quickly turn to sugar. These include dairy products, desserts, and chocolates. What increases the risk? This condition is more likely to develop in:  Teenagers.  People who have a family history of acne. What are the signs or symptoms? Symptoms include:  Small, red bumps (pimples or  papules).  Whiteheads.  Blackheads.  Small, pus-filled pimples (pustules).  Big, red pimples or pustules that feel tender. More severe acne can cause:  An abscess. This is an infected area that contains a collection of pus.  Cysts. These are hard, painful, fluid-filled sacs.  Scars. These can happen after large pimples heal. How is this diagnosed? This condition is diagnosed with a medical history and physical exam. Blood tests may also be done. How is this treated? Treatment for this condition can vary depending on the severity of your acne. Treatment may include:  Creams and lotions that prevent oil glands from clogging.  Creams and lotions that treat or prevent infections and inflammation.  Antibiotic medicines that are applied to the skin or taken as a pill.  Pills that decrease sebum production.  Birth control pills.  Light or laser treatments.  Injections of medicine into the affected areas.  Chemicals that cause peeling of the skin.  Surgery. Your health care provider will also recommend the best way to take care of your skin. Good skin care is the most important part of treatment. Follow these instructions at home: Skin care Take care of your skin as told by your health care provider. You may be told to do these things:  Wash your  skin gently at least two times each day, as well as: ? After you exercise. ? Before you go to bed.  Use mild soap.  Apply a water-based skin moisturizer after you wash your skin.  Use a sunscreen or sunblock with SPF 30 or greater. This is especially important if you are using acne medicines.  Choose cosmetics that will not block your oil glands (are noncomedogenic). Medicines  Take over-the-counter and prescription medicines only as told by your health care provider.  If you were prescribed an antibiotic medicine, apply it or take it as told by your health care provider. Do not stop using the antibiotic even if your  condition improves. General instructions  Keep your hair clean and off your face. If you have oily hair, shampoo your hair regularly or daily.  Avoid wearing tight headbands or hats.  Avoid picking or squeezing your pimples. That can make your acne worse and cause scarring.  Shave gently and only when necessary.  Keep a food journal to figure out if any foods are linked to your acne. Avoid dairy products, desserts, and chocolates.  Take steps to manage and reduce stress.  Keep all follow-up visits as told by your health care provider. This is important. Contact a health care provider if:  Your acne is not better after eight weeks.  Your acne gets worse.  You have a large area of skin that is red or tender.  You think that you are having side effects from any acne medicine. Summary  Acne is a skin problem that causes pimples and other skin changes. Acne is a common skin problem, especially for teenagers. Acne usually goes away over time.  Acne is commonly triggered by changes in your hormones. There are many other causes, such as stress, diet, and certain medicines.  Follow your health care provider's instructions for how to take care of your skin. Good skin care is the most important part of treatment.  Take over-the-counter and prescription medicines only as told by your health care provider.  Contact your health care provider if you think that you are having side effects from any acne medicine. This information is not intended to replace advice given to you by your health care provider. Make sure you discuss any questions you have with your health care provider. Document Revised: 06/29/2017 Document Reviewed: 06/29/2017 Elsevier Patient Education  2021 [employer].

## 2020-03-21 LAB — CERVICOVAGINAL ANCILLARY ONLY
Bacterial Vaginitis (gardnerella): NEGATIVE
Candida Glabrata: POSITIVE — AB
Candida Vaginitis: POSITIVE — AB
Chlamydia: NEGATIVE
Comment: NEGATIVE
Comment: NEGATIVE
Comment: NEGATIVE
Comment: NEGATIVE
Comment: NEGATIVE
Comment: NORMAL
Neisseria Gonorrhea: NEGATIVE
Trichomonas: NEGATIVE

## 2020-03-21 MED ORDER — FLUCONAZOLE 150 MG PO TABS
150.0000 mg | ORAL_TABLET | Freq: Once | ORAL | 3 refills | Status: AC
Start: 2020-03-21 — End: 2020-03-21

## 2020-03-21 MED ORDER — BORIC ACID CRYS: 600.0000 mg | CRYSTALS | Freq: Every day | 2 refills | Status: AC

## 2020-03-21 NOTE — Addendum Note (Signed)
Addended by: Jaynie Collins A on: 03/21/2020 06:22 PM   Modules accepted: Orders

## 2020-03-22 ENCOUNTER — Ambulatory Visit: Payer: Medicaid Other | Admitting: Family Medicine

## 2020-03-26 LAB — TSH+PRL+TESTT+TESTF+17OHP
17-Hydroxyprogesterone: 31 ng/dL
Prolactin: 11.1 ng/mL (ref 4.8–23.3)
TSH: 0.858 u[IU]/mL (ref 0.450–4.500)
Testosterone, Free: 5.1 pg/mL — ABNORMAL HIGH (ref 0.0–4.2)
Testosterone, Total, LC/MS: 27.1 ng/dL (ref 10.0–55.0)

## 2020-03-26 LAB — CBC
Hematocrit: 40.1 % (ref 34.0–46.6)
Hemoglobin: 13.7 g/dL (ref 11.1–15.9)
MCH: 30.6 pg (ref 26.6–33.0)
MCHC: 34.2 g/dL (ref 31.5–35.7)
MCV: 90 fL (ref 79–97)
Platelets: 233 10*3/uL (ref 150–450)
RBC: 4.48 x10E6/uL (ref 3.77–5.28)
RDW: 12.8 % (ref 11.7–15.4)
WBC: 7.1 10*3/uL (ref 3.4–10.8)

## 2020-03-26 LAB — FOLLICLE STIMULATING HORMONE: FSH: 3.7 m[IU]/mL

## 2020-03-26 LAB — DHEA-SULFATE: DHEA-SO4: 293 ug/dL (ref 110.0–431.7)

## 2020-03-26 LAB — BETA HCG QUANT (REF LAB): hCG Quant: <1 m[IU]/mL

## 2020-03-26 LAB — HEMOGLOBIN A1C
Est. average glucose Bld gHb Est-mCnc: 108 mg/dL
Hgb A1c MFr Bld: 5.4 % (ref 4.8–5.6)

## 2020-03-26 LAB — FERRITIN: Ferritin: 165 ng/mL — ABNORMAL HIGH (ref 15–150)

## 2020-03-27 DIAGNOSIS — E282 Polycystic ovarian syndrome: Secondary | ICD-10-CM

## 2020-03-27 DIAGNOSIS — L700 Acne vulgaris: Secondary | ICD-10-CM

## 2020-03-28 DIAGNOSIS — E282 Polycystic ovarian syndrome: Secondary | ICD-10-CM | POA: Insufficient documentation

## 2020-03-28 DIAGNOSIS — L700 Acne vulgaris: Secondary | ICD-10-CM | POA: Insufficient documentation

## 2020-03-28 MED ORDER — SPIRONOLACTONE 50 MG PO TABS: 50.0000 mg | ORAL_TABLET | Freq: Two times a day (BID) | ORAL | 2 refills | Status: DC

## 2020-04-01 ENCOUNTER — Other Ambulatory Visit: Payer: Self-pay

## 2020-04-01 ENCOUNTER — Ambulatory Visit
Admission: RE | Admit: 2020-04-01 | Discharge: 2020-04-01 | Disposition: A | Discharge status: Home / Self Care | Payer: Medicaid Other | Source: Ambulatory Visit | Attending: Obstetrics & Gynecology | Admitting: Obstetrics & Gynecology

## 2020-04-01 DIAGNOSIS — N939 Abnormal uterine and vaginal bleeding, unspecified: Secondary | ICD-10-CM | POA: Diagnosis not present

## 2020-05-30 NOTE — Therapy (Signed)
[place], [place], [address] Phone: [phone]   Fax:  [phone]  Physical Therapy Treatment/Discharge  Patient Details  Name: Amanda [name] MRN: [id number] Date of Birth: [birth year] Referring Provider (PT): Vicenta Aly, NP   Encounter Date: 02/06/2020    Past Medical History:  Diagnosis Date  . Hx of chlamydia infection 05/04/14   treated, TOC-neg  . Hx of gonorrhea 05/04/14   treated, TOC-neg  . Hx of wisdom tooth extraction     Past Surgical History:  Procedure Laterality Date  . WISDOM TOOTH EXTRACTION      There were no vitals filed for this visit.                                   PT Long Term Goals - 02/06/20 1327      PT LONG TERM GOAL #1   Title Independent with initial HEP    Baseline 3 x per week currently    Time 4    Period Weeks    Status On-going      PT LONG TERM GOAL #2   Title Pt. 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 Acne vulgaris 03/28/2020  . PCOS (polycystic ovarian syndrome) 03/28/2020  . Fall 10/30/2015       PHYSICAL THERAPY DISCHARGE SUMMARY  Visits from Start of Care: 2  Current functional level  related to goals / functional outcomes: Patient did not return for further therapy after last session 02/05/21 with no show for last 2 scheduled visits.   Remaining deficits: Current status unknown   Education / Equipment: HEP  Plan: Patient agrees to discharge.  Patient goals were not met. Patient is being discharged due to not returning since the last visit.  ?????          Beaulah Dinning, PT, DPT 05/30/20 11:44 AM     [facility] [address] [place], [place], [address] Phone: [phone]   Fax:  [phone]  Name: Rylynne Schicker MRN: [id number] Date of Birth: [birth year]

## 2022-12-04 ENCOUNTER — Encounter (HOSPITAL_COMMUNITY): Payer: Self-pay | Admitting: Family Medicine

## 2022-12-04 ENCOUNTER — Inpatient Hospital Stay (HOSPITAL_COMMUNITY)
Admission: AD | Admit: 2022-12-04 | Discharge: 2022-12-04 | Disposition: A | Discharge status: Home / Self Care | Payer: Medicaid Other | Attending: Family Medicine | Admitting: Family Medicine

## 2022-12-04 DIAGNOSIS — O99352 Diseases of the nervous system complicating pregnancy, second trimester: Secondary | ICD-10-CM | POA: Insufficient documentation

## 2022-12-04 DIAGNOSIS — O26892 Other specified pregnancy related conditions, second trimester: Secondary | ICD-10-CM | POA: Diagnosis not present

## 2022-12-04 DIAGNOSIS — N858 Other specified noninflammatory disorders of uterus: Secondary | ICD-10-CM

## 2022-12-04 DIAGNOSIS — O99891 Other specified diseases and conditions complicating pregnancy: Secondary | ICD-10-CM | POA: Diagnosis not present

## 2022-12-04 DIAGNOSIS — G43009 Migraine without aura, not intractable, without status migrainosus: Secondary | ICD-10-CM

## 2022-12-04 DIAGNOSIS — Z3A24 24 weeks gestation of pregnancy: Secondary | ICD-10-CM | POA: Diagnosis not present

## 2022-12-04 DIAGNOSIS — O99012 Anemia complicating pregnancy, second trimester: Secondary | ICD-10-CM | POA: Insufficient documentation

## 2022-12-04 DIAGNOSIS — O99019 Anemia complicating pregnancy, unspecified trimester: Secondary | ICD-10-CM

## 2022-12-04 LAB — URINALYSIS, ROUTINE W REFLEX MICROSCOPIC
Bilirubin Urine: NEGATIVE
Glucose, UA: NEGATIVE mg/dL
Hgb urine dipstick: NEGATIVE
Ketones, ur: 5 mg/dL — AB
Leukocytes,Ua: NEGATIVE
Nitrite: NEGATIVE
Protein, ur: NEGATIVE mg/dL
Specific Gravity, Urine: 1.021 (ref 1.005–1.030)
pH: 6 (ref 5.0–8.0)

## 2022-12-04 LAB — COMPREHENSIVE METABOLIC PANEL
ALT: 16 U/L (ref 0–44)
AST: 18 U/L (ref 15–41)
Albumin: 3.2 g/dL — ABNORMAL LOW (ref 3.5–5.0)
Alkaline Phosphatase: 44 U/L (ref 38–126)
Anion gap: 8 (ref 5–15)
BUN: 8 mg/dL (ref 6–20)
CO2: 22 mmol/L (ref 22–32)
Calcium: 9.2 mg/dL (ref 8.9–10.3)
Chloride: 103 mmol/L (ref 98–111)
Creatinine, Ser: 0.59 mg/dL (ref 0.44–1.00)
GFR, Estimated: >60 mL/min (ref >60)
Glucose, Bld: 94 mg/dL (ref 70–99)
Potassium: 3.5 mmol/L (ref 3.5–5.1)
Sodium: 133 mmol/L — ABNORMAL LOW (ref 135–145)
Total Bilirubin: 0.4 mg/dL (ref 0.3–1.2)
Total Protein: 6.9 g/dL (ref 6.5–8.1)

## 2022-12-04 LAB — CBC
HCT: 33.3 % — ABNORMAL LOW (ref 36.0–46.0)
Hemoglobin: 11.1 g/dL — ABNORMAL LOW (ref 12.0–15.0)
MCH: 30.8 pg (ref 26.0–34.0)
MCHC: 33.3 g/dL (ref 30.0–36.0)
MCV: 92.5 fL (ref 80.0–100.0)
Platelets: 194 10*3/uL (ref 150–400)
RBC: 3.6 MIL/uL — ABNORMAL LOW (ref 3.87–5.11)
RDW: 12.8 % (ref 11.5–15.5)
WBC: 10.9 10*3/uL — ABNORMAL HIGH (ref 4.0–10.5)
nRBC: 0 % (ref 0.0–0.2)

## 2022-12-04 LAB — FERRITIN: Ferritin: 11 ng/mL (ref 11–307)

## 2022-12-04 MED ORDER — LACTATED RINGERS IV BOLUS
1000.0000 mL | Freq: Once | INTRAVENOUS | Status: AC
  Administered 2022-12-04: 1000 mL via INTRAVENOUS

## 2022-12-04 MED ORDER — CYCLOBENZAPRINE HCL 10 MG PO TABS: 10.0000 mg | ORAL_TABLET | Freq: Three times a day (TID) | ORAL | 0 refills | Status: AC | PRN

## 2022-12-04 MED ORDER — METOCLOPRAMIDE HCL 5 MG/ML IJ SOLN
10.0000 mg | Freq: Once | INTRAMUSCULAR | Status: AC
  Administered 2022-12-04: 10 mg via INTRAVENOUS
  Filled 2022-12-04: qty 2

## 2022-12-04 MED ORDER — ACETAMINOPHEN 500 MG PO TABS
1000.0000 mg | ORAL_TABLET | Freq: Once | ORAL | Status: AC
  Administered 2022-12-04: 1000 mg via ORAL
  Filled 2022-12-04: qty 2

## 2022-12-04 MED ORDER — FERROUS SULFATE 325 (65 FE) MG PO TABS: 325.0000 mg | ORAL_TABLET | ORAL | 0 refills | Status: AC

## 2022-12-04 MED ORDER — DIPHENHYDRAMINE HCL 50 MG/ML IJ SOLN
12.5000 mg | Freq: Once | INTRAMUSCULAR | Status: AC
  Administered 2022-12-04: 12.5 mg via INTRAVENOUS
  Filled 2022-12-04: qty 1

## 2022-12-04 MED ORDER — CYCLOBENZAPRINE HCL 5 MG PO TABS
10.0000 mg | ORAL_TABLET | Freq: Once | ORAL | Status: AC
  Administered 2022-12-04: 10 mg via ORAL
  Filled 2022-12-04: qty 2

## 2022-12-04 NOTE — MAU Note (Signed)
.  Amanda [name] is a 27 y.o. at [redacted]w[redacted]d here in MAU reporting: increased swelling in her hands and feet/legs. C/o headache(has not taken anything for it did try some ice to her head to help but not meds.) Feels nausea and vomited a few days ago. Still has nausea today. Reports she has been having lower abd cramping on and off for over a week. Had told her provider at her appointment this week and thy tested hr for STD. Denies any vag bleeding reports normal vag discharge. Reports she failed her sugar test this week.  Good fetal movement reported.  LMP:  Onset of complaint: over a week  Pain score: 7-9 Vitals:   12/04/22 1808  BP: 112/63  Pulse: 88  Resp: 18  Temp: 98.2 F (36.8 C)     FHT:167 Lab orders placed from triage:  u/a

## 2022-12-04 NOTE — MAU Provider Note (Signed)
Chief Complaint: Headache, Leg Swelling, Nausea, and Abdominal Pain  Provider at bedside 1847    SUBJECTIVE HPI: Amanda [name] is a 27 y.o. [id number] at [redacted]w[redacted]d by early ultrasound who presents to maternity admissions reporting headache, leg swelling, cramping. Patient receives care at [facility].  Patient notes having vague, bothersome symptoms this pregnancy that she has not had in others. Most bothersome is her headache. Bilateral temples, frontal 5/10. Felt improved with ice and sleep last night. Mild sensitivity to light. No hx of migraines.  Additionally noticing more LEE, abdominal cramping. Denies VB, LOF, DFM, change in vaginal discharge/odor/itching, fever/chills, urinary symptoms. Notes she failed 1 hour GTT recently.  Patient denies having any issues with hypertension, although records show concern for elevated BP at outside psychiatry visit. Did not find any other elevated Bps in last view prenatal visits.  HPI  Past Medical History:  Diagnosis Date   Hx of chlamydia infection 05/04/14   treated, TOC-neg   Hx of gonorrhea 05/04/14   treated, TOC-neg   Hx of wisdom tooth extraction    Past Surgical History:  Procedure Laterality Date   WISDOM TOOTH EXTRACTION     Social History   Socioeconomic History   Marital status: Single    Spouse name: Not on file   Number of children: Not on file   Years of education: Not on file   Highest education level: Not on file  Occupational History   Not on file  Tobacco Use   Smoking status: Never   Smokeless tobacco: Never  Substance and Sexual Activity   Alcohol use: Yes    Alcohol/week: 1.0 standard drink of alcohol    Types: 1 Glasses of wine per week    Comment: 08/01/17 was last you use   Drug use: Yes    Types: Marijuana    Comment: 08/01/17 was last use   Sexual activity: Not Currently    Birth control/protection: Condom  Other Topics Concern   Not on file  Social History Narrative   ** Merged History Encounter **        Social Determinants of Health   Financial Resource Strain: Low Risk  (05/10/2022)   Received from [facility]   Overall Financial Resource Strain (CARDIA)    Difficulty of Paying Living Expenses: Not hard at all  Food Insecurity: No Food Insecurity (07/31/2022)   Received from [facility]   Hunger Vital Sign    Worried About Running Out of Food in the Last Year: Never true    Ran Out of Food in the Last Year: Never true  Transportation Needs: No Transportation Needs (07/31/2022)   Received from [facility] - Transportation    Lack of Transportation (Medical): No    Lack of Transportation (Non-Medical): No  Physical Activity: Insufficiently Active (10/08/2021)   Received from [facility]   Exercise Vital Sign    Days of Exercise per Week: 2 days    Minutes of Exercise per Session: 60 min  Stress: Stress Concern Present (07/31/2022)   Received from [facility] - Occupational Stress Questionnaire    Feeling of Stress : Very much  Social Connections: Socially Integrated (05/10/2022)   Received from [facility]   Social Network    How would you rate your social network (family, work, friends)?: Good participation with social networks  Intimate Partner Violence: Not At Risk (10/20/2022)   Received from [facility]   Humiliation, Afraid, Rape,  and Kick questionnaire    Fear of Current or Ex-Partner: No    Emotionally Abused: No    Physically Abused: No    Sexually Abused: No   No current facility-administered medications on file prior to encounter.   Current Outpatient Medications on File Prior to Encounter  Medication Sig Dispense Refill   acetaminophen (TYLENOL) 500 MG tablet Take 1,000 mg by mouth every 6 (six) hours as needed for mild pain, moderate pain or headache.      clindamycin-benzoyl peroxide (BENZACLIN) gel Apply topically 2 (two) times daily. (Patient not taking: Reported on 12/04/2022)      doxycycline (VIBRA-TABS) 100 MG tablet Take 100 mg by mouth 2 (two) times daily. (Patient not taking: Reported on 12/04/2022)     ibuprofen (ADVIL) 800 MG tablet Take by mouth. (Patient not taking: Reported on 12/04/2022)     naproxen (NAPROSYN) 375 MG tablet Take 1 tablet (375 mg total) by mouth 2 (two) times daily as needed for moderate pain. (Patient not taking: Reported on 03/20/2020) 30 tablet 0   RETIN-A MICRO 0.04 % gel SMARTSIG:Sparingly Topical Every Night     spironolactone (ALDACTONE) 50 MG tablet Take 1 tablet (50 mg total) by mouth 2 (two) times daily. Will start with 50 mg twice daily, and increase to 100 mg twice daily if needed (Patient not taking: Reported on 12/04/2022) 60 tablet 2   Allergies  Allergen Reactions   Bee Venom Rash    ROS:  Pertinent positives/negatives listed above.  I have reviewed patient's Past Medical Hx, Surgical Hx, Family Hx, Social Hx, medications and allergies.   Physical Exam  Patient Vitals for the past 24 hrs:  BP Temp Pulse Resp Height Weight  12/04/22 1828 112/68 -- 81 -- -- --  12/04/22 1808 112/63 98.2 F (36.8 C) 88 18 5\' 2"  (1.575 m) 86.2 kg   Constitutional: Well-developed, well-nourished female in no acute distress.  Cardiovascular: normal rate Respiratory: normal effort GI: Abd soft, non-tender MS: Extremities nontender, normal ROM, trace edema b/l, no swelling/erythema Neurologic: Alert and oriented x 4  FHT:  Baseline 150 , moderate variability, accelerations present, no decelerations Contractions: UI  LAB RESULTS Results for orders placed or performed during the hospital encounter of 12/04/22 (from the past 24 hour(s))  Urinalysis, Routine w reflex microscopic -Urine, Clean Catch     Status: Abnormal   Collection Time: 12/04/22  6:34 PM  Result Value Ref Range   Color, Urine YELLOW YELLOW   APPearance HAZY (A) CLEAR   Specific Gravity, Urine 1.021 1.005 - 1.030   pH 6.0 5.0 - 8.0   Glucose, UA NEGATIVE NEGATIVE mg/dL    Hgb urine dipstick NEGATIVE NEGATIVE   Bilirubin Urine NEGATIVE NEGATIVE   Ketones, ur 5 (A) NEGATIVE mg/dL   Protein, ur NEGATIVE NEGATIVE mg/dL   Nitrite NEGATIVE NEGATIVE   Leukocytes,Ua NEGATIVE NEGATIVE  CBC     Status: Abnormal   Collection Time: 12/04/22  7:24 PM  Result Value Ref Range   WBC 10.9 (H) 4.0 - 10.5 K/uL   RBC 3.60 (L) 3.87 - 5.11 MIL/uL   Hemoglobin 11.1 (L) 12.0 - 15.0 g/dL   HCT 16.1 (L) 09.6 - 04.5 %   MCV 92.5 80.0 - 100.0 fL   MCH 30.8 26.0 - 34.0 pg   MCHC 33.3 30.0 - 36.0 g/dL   RDW 40.9 81.1 - 91.4 %   Platelets 194 150 - 400 K/uL   nRBC 0.0 0.0 - 0.2 %  IMAGING No results found.  MAU Management/MDM: Orders Placed This Encounter  Procedures   Urinalysis, Routine w reflex microscopic -Urine, Clean Catch   CBC   Comprehensive metabolic panel   Ferritin    Meds ordered this encounter  Medications   lactated ringers bolus 1,000 mL   acetaminophen (TYLENOL) tablet 1,000 mg   cyclobenzaprine (FLEXERIL) tablet 10 mg   metoCLOPramide (REGLAN) injection 10 mg   diphenhydrAMINE (BENADRYL) injection 12.5 mg   cyclobenzaprine (FLEXERIL) 10 MG tablet    Sig: Take 1 tablet (10 mg total) by mouth 3 (three) times daily as needed for muscle spasms.    Dispense:  30 tablet    Refill:  0   ferrous sulfate 325 (65 FE) MG tablet    Sig: Take 1 tablet (325 mg total) by mouth every other day.    Dispense:  45 tablet    Refill:  0    Patient presents with several complaints. Reassuring fetal status, no contractions, and wnl VS. Will treat as migraine above. Fluids will additionally likely help cramping/UI. Will rule-out UTI. Patient with recent vaginal swabs, so will not repeat these.  2001: Patient reassessed. Headache and abdominal cramping resolved. Fetal status remained reassuring. Discussed hydration, tylenol, flexeril PRN for headaches at home. Additionally recommended starting iron supplement for mild anemia to rule-out this as cause for  headaches.  Pending CMP signed out to [facility], CNM.  ASSESSMENT 1. Migraine without aura and without status migrainosus, not intractable   2. Uterine irritability   3. Anemia during pregnancy   4. [redacted] weeks gestation of pregnancy     PLAN Discharge home with strict return precautions. Allergies as of 12/04/2022       Reactions   Bee Venom Rash        Medication List     STOP taking these medications    clindamycin-benzoyl peroxide gel Commonly known as: BENZACLIN   doxycycline 100 MG tablet Commonly known as: VIBRA-TABS   ibuprofen 800 MG tablet Commonly known as: ADVIL   naproxen 375 MG tablet Commonly known as: NAPROSYN   Retin-A Micro 0.04 % gel Generic drug: tretinoin microspheres   spironolactone 50 MG tablet Commonly known as: Aldactone       TAKE these medications    acetaminophen 500 MG tablet Commonly known as: TYLENOL Take 1,000 mg by mouth every 6 (six) hours as needed for mild pain, moderate pain or headache.   cyclobenzaprine 10 MG tablet Commonly known as: FLEXERIL Take 1 tablet (10 mg total) by mouth 3 (three) times daily as needed for muscle spasms.   ferrous sulfate 325 (65 FE) MG tablet Take 1 tablet (325 mg total) by mouth every other day.        Wylene Simmer, MD OB Fellow 12/04/2022  7:59 PM

## 2022-12-27 ENCOUNTER — Inpatient Hospital Stay (HOSPITAL_COMMUNITY)
Admission: AD | Admit: 2022-12-27 | Discharge: 2022-12-27 | Disposition: A | Discharge status: Home / Self Care | Payer: Medicaid Other | Attending: Obstetrics & Gynecology | Admitting: Obstetrics & Gynecology

## 2022-12-27 ENCOUNTER — Encounter (HOSPITAL_COMMUNITY): Payer: Self-pay | Admitting: Obstetrics & Gynecology

## 2022-12-27 ENCOUNTER — Other Ambulatory Visit: Payer: Self-pay

## 2022-12-27 DIAGNOSIS — Z3A27 27 weeks gestation of pregnancy: Secondary | ICD-10-CM

## 2022-12-27 DIAGNOSIS — R350 Frequency of micturition: Secondary | ICD-10-CM | POA: Diagnosis present

## 2022-12-27 DIAGNOSIS — O99891 Other specified diseases and conditions complicating pregnancy: Secondary | ICD-10-CM

## 2022-12-27 DIAGNOSIS — O26892 Other specified pregnancy related conditions, second trimester: Secondary | ICD-10-CM | POA: Insufficient documentation

## 2022-12-27 DIAGNOSIS — M549 Dorsalgia, unspecified: Secondary | ICD-10-CM | POA: Diagnosis present

## 2022-12-27 LAB — URINALYSIS, ROUTINE W REFLEX MICROSCOPIC
Bilirubin Urine: NEGATIVE
Glucose, UA: NEGATIVE mg/dL
Hgb urine dipstick: NEGATIVE
Ketones, ur: NEGATIVE mg/dL
Leukocytes,Ua: NEGATIVE
Nitrite: NEGATIVE
Protein, ur: NEGATIVE mg/dL
Specific Gravity, Urine: 1.003 — ABNORMAL LOW (ref 1.005–1.030)
pH: 7 (ref 5.0–8.0)

## 2022-12-27 MED ORDER — ACETAMINOPHEN 500 MG PO TABS
1000.0000 mg | ORAL_TABLET | Freq: Once | ORAL | Status: AC
  Administered 2022-12-27: 1000 mg via ORAL
  Filled 2022-12-27: qty 2

## 2022-12-27 NOTE — Discharge Instructions (Signed)

## 2022-12-27 NOTE — MAU Provider Note (Signed)
History     CSN: [id number]  Arrival date and time: 12/27/22 1653   Event Date/Time   First Provider Initiated Contact with Patient 12/27/22 1729      Chief Complaint  Patient presents with   Back Pain   Urinary Frequency   HPI  Amanda [name] is a 27 y.o. [id number] at [redacted]w[redacted]d who presents for evaluation of back pain. Patient reports the pain started early today and has persisted. Patient rates the pain as a 5/10 and has not tried anything for the pain. She reports she was also urinating more often so she was concerned this was related to a UTI. She denies any dysuria. She denies any vaginal bleeding, discharge, and leaking of fluid. Denies any constipation, diarrhea or any urinary complaints. Reports normal fetal movement.   OB History     Gravida  4   Para  2   Term  2   Preterm  0   AB  1   Living  2      SAB  0   IAB  1   Ectopic  0   Multiple      Live Births  2           Past Medical History:  Diagnosis Date   Hx of chlamydia infection 05/04/14   treated, TOC-neg   Hx of gonorrhea 05/04/14   treated, TOC-neg   Hx of wisdom tooth extraction     Past Surgical History:  Procedure Laterality Date   WISDOM TOOTH EXTRACTION      Family History  Adopted: Yes  Problem Relation Age of Onset   Cancer Mother        pseudotumor in brain    Social History   Tobacco Use   Smoking status: Never   Smokeless tobacco: Never  Substance Use Topics   Alcohol use: Yes    Alcohol/week: 1.0 standard drink of alcohol    Types: 1 Glasses of wine per week    Comment: 08/01/17 was last you use   Drug use: Yes    Types: Marijuana    Comment: 08/01/17 was last use    Allergies:  Allergies  Allergen Reactions   Bee Venom Rash    Medications Prior to Admission  Medication Sig Dispense Refill Last Dose   cyclobenzaprine (FLEXERIL) 10 MG tablet Take 1 tablet (10 mg total) by mouth 3 (three) times daily as needed for muscle spasms. 30 tablet 0 Past Month    acetaminophen (TYLENOL) 500 MG tablet Take 1,000 mg by mouth every 6 (six) hours as needed for mild pain, moderate pain or headache.       ferrous sulfate 325 (65 FE) MG tablet Take 1 tablet (325 mg total) by mouth every other day. 45 tablet 0     Review of Systems  Constitutional: Negative.  Negative for fatigue and fever.  HENT: Negative.    Respiratory: Negative.  Negative for shortness of breath.   Cardiovascular: Negative.  Negative for chest pain.  Gastrointestinal: Negative.  Negative for abdominal pain, constipation, diarrhea, nausea and vomiting.  Genitourinary:  Positive for frequency. Negative for dysuria, vaginal bleeding and vaginal discharge.  Musculoskeletal:  Positive for back pain.  Neurological: Negative.  Negative for dizziness and headaches.   Physical Exam   Blood pressure 127/88, pulse 99, temperature (!) 97.4 F (36.3 C), resp. rate 17, SpO2 100%.  Patient Vitals for the past 24 hrs:  BP Temp Pulse Resp SpO2  12/27/22 1704 127/88 (!) 97.4  F (36.3 C) 99 17 100 %    Physical Exam Vitals and nursing note reviewed.  Constitutional:      General: She is not in acute distress.    Appearance: She is well-developed.  HENT:     Head: Normocephalic.  Eyes:     Pupils: Pupils are equal, round, and reactive to light.  Cardiovascular:     Rate and Rhythm: Normal rate and regular rhythm.     Heart sounds: Normal heart sounds.  Pulmonary:     Effort: Pulmonary effort is normal. No respiratory distress.     Breath sounds: Normal breath sounds.  Abdominal:     General: Bowel sounds are normal. There is no distension.     Palpations: Abdomen is soft.     Tenderness: There is no abdominal tenderness.  Skin:    General: Skin is warm and dry.  Neurological:     Mental Status: She is alert and oriented to person, place, and time.  Psychiatric:        Mood and Affect: Mood normal.        Behavior: Behavior normal.        Thought Content: Thought content normal.         Judgment: Judgment normal.     Fetal Tracing:  Baseline: 150 Variability: moderate Accels: 15x15 Decels: none  Toco: UI  Dilation: Closed Effacement (%): Thick Station: -3 Exam by:: Iven Finn   MAU Course  Procedures  Results for orders placed or performed during the hospital encounter of 12/27/22 (from the past 24 hour(s))  Urinalysis, Routine w reflex microscopic -Urine, Clean Catch     Status: Abnormal   Collection Time: 12/27/22  5:10 PM  Result Value Ref Range   Color, Urine COLORLESS (A) YELLOW   APPearance CLEAR CLEAR   Specific Gravity, Urine 1.003 (L) 1.005 - 1.030   pH 7.0 5.0 - 8.0   Glucose, UA NEGATIVE NEGATIVE mg/dL   Hgb urine dipstick NEGATIVE NEGATIVE   Bilirubin Urine NEGATIVE NEGATIVE   Ketones, ur NEGATIVE NEGATIVE mg/dL   Protein, ur NEGATIVE NEGATIVE mg/dL   Nitrite NEGATIVE NEGATIVE   Leukocytes,Ua NEGATIVE NEGATIVE     MDM Labs ordered and reviewed.   UA Tylenol PO Cervix closed, no uterine activity  Upon reassessment, patient sleeping comfortably. Reports pain has improved with tylenol.   Assessment and Plan   1. Back pain affecting pregnancy in second trimester   2. [redacted] weeks gestation of pregnancy     -Discharge home in stable condition -Preterm labor precautions discussed -Patient advised to follow-up with OB as scheduled for prenatal care -Patient may return to MAU as needed or if her condition were to change or worsen  Rolm Bookbinder, CNM 12/27/2022, 5:29 PM

## 2022-12-27 NOTE — MAU Note (Signed)
.  Amanda [name] is a 27 y.o. at [redacted]w[redacted]d here in MAU reporting: lower back pain that is constant and has been on going all day.  Pt states she has also been urinating more frequently but without pain.     Pain score: 7 Vitals:   12/27/22 1704  BP: 127/88  Pulse: 99  Resp: 17  Temp: (!) 97.4 F (36.3 C)  SpO2: 100%     FHT:140 Lab orders placed from triage:   ua

## 2023-02-23 ENCOUNTER — Inpatient Hospital Stay (HOSPITAL_COMMUNITY)
Admission: AD | Admit: 2023-02-23 | Discharge: 2023-02-24 | Disposition: A | Discharge status: Home / Self Care | Payer: Medicaid Other | Attending: Obstetrics & Gynecology | Admitting: Obstetrics & Gynecology

## 2023-02-23 DIAGNOSIS — W19XXXA Unspecified fall, initial encounter: Secondary | ICD-10-CM

## 2023-02-23 DIAGNOSIS — O26893 Other specified pregnancy related conditions, third trimester: Secondary | ICD-10-CM | POA: Insufficient documentation

## 2023-02-23 DIAGNOSIS — Z3A36 36 weeks gestation of pregnancy: Secondary | ICD-10-CM | POA: Insufficient documentation

## 2023-02-23 DIAGNOSIS — Z043 Encounter for examination and observation following other accident: Secondary | ICD-10-CM | POA: Insufficient documentation

## 2023-02-23 DIAGNOSIS — W228XXA Striking against or struck by other objects, initial encounter: Secondary | ICD-10-CM | POA: Insufficient documentation

## 2023-02-23 DIAGNOSIS — R52 Pain, unspecified: Secondary | ICD-10-CM | POA: Insufficient documentation

## 2023-02-23 NOTE — MAU Note (Signed)
.  Amanda [name] is a 27 y.o. at [redacted]w[redacted]d here in MAU reporting falling down about 5 stairs at 2230. She fell down the steps on her bottom but states hit her abdomen on the step rails. Reports good FM before the fall but has not paid attention since then. Denies LOF or VB but some mucous d/c. Having some back pain and sore all over from the fall   Onset of complaint: 2230 Pain score: 8 Vitals:   02/23/23 2356  BP: 124/79  Pulse: (!) 116  Resp: 17  Temp: 98.4 F (36.9 C)  SpO2: 100%     FHT:166 Lab orders placed from triage: u/a

## 2023-02-24 ENCOUNTER — Encounter (HOSPITAL_COMMUNITY): Payer: Self-pay | Admitting: Obstetrics & Gynecology

## 2023-02-24 ENCOUNTER — Inpatient Hospital Stay (HOSPITAL_COMMUNITY): Payer: Medicaid Other

## 2023-02-24 DIAGNOSIS — O9A213 Injury, poisoning and certain other consequences of external causes complicating pregnancy, third trimester: Secondary | ICD-10-CM | POA: Diagnosis not present

## 2023-02-24 DIAGNOSIS — R52 Pain, unspecified: Secondary | ICD-10-CM | POA: Diagnosis not present

## 2023-02-24 DIAGNOSIS — O26893 Other specified pregnancy related conditions, third trimester: Secondary | ICD-10-CM | POA: Diagnosis not present

## 2023-02-24 DIAGNOSIS — W109XXA Fall (on) (from) unspecified stairs and steps, initial encounter: Secondary | ICD-10-CM | POA: Diagnosis not present

## 2023-02-24 DIAGNOSIS — Z3A36 36 weeks gestation of pregnancy: Secondary | ICD-10-CM

## 2023-02-24 DIAGNOSIS — Z043 Encounter for examination and observation following other accident: Secondary | ICD-10-CM | POA: Diagnosis not present

## 2023-02-24 DIAGNOSIS — W19XXXA Unspecified fall, initial encounter: Secondary | ICD-10-CM | POA: Diagnosis not present

## 2023-02-24 DIAGNOSIS — W228XXA Striking against or struck by other objects, initial encounter: Secondary | ICD-10-CM | POA: Diagnosis not present

## 2023-02-24 LAB — URINALYSIS, ROUTINE W REFLEX MICROSCOPIC
Bilirubin Urine: NEGATIVE
Glucose, UA: NEGATIVE mg/dL
Hgb urine dipstick: NEGATIVE
Ketones, ur: 5 mg/dL — AB
Leukocytes,Ua: NEGATIVE
Nitrite: NEGATIVE
Protein, ur: >=300 mg/dL — AB
Specific Gravity, Urine: 1.018 (ref 1.005–1.030)
pH: 6 (ref 5.0–8.0)

## 2023-02-24 MED ORDER — ACETAMINOPHEN 500 MG PO TABS
1000.0000 mg | ORAL_TABLET | Freq: Once | ORAL | Status: AC
  Administered 2023-02-24: 1000 mg via ORAL
  Filled 2023-02-24: qty 2

## 2023-02-24 MED ORDER — CYCLOBENZAPRINE HCL 10 MG PO TABS
10.0000 mg | ORAL_TABLET | Freq: Two times a day (BID) | ORAL | 0 refills | Status: AC | PRN
Start: 2023-02-24 — End: ?

## 2023-02-24 NOTE — MAU Provider Note (Addendum)
History     CSN: [id number]  Arrival date and time: 02/23/23 2342   Event Date/Time   First Provider Initiated Contact with Patient 02/24/23 0028      Chief Complaint  Patient presents with   Amanda [name] , a  27 y.o. [phone] at [redacted]w[redacted]d presents to MAU with complaints of body aches following at fall this evening around 2230. Patient states that she was coming down the stairs and fell down the last 5 stair. She states that her belly hit the railing and she fell backwards onto her back and shoulders. She currently states that the back is more so in her back and describes as a dull ache that she rates as a 8/10. She denies attempting to relieve symptoms. She states that she was feeling movement prior to the fall but states that since she has only felt flutters. She denies vaginal bleeding, and leaking of fluid.          OB History     Gravida  4   Para  2   Term  2   Preterm  0   AB  1   Living  2      SAB  0   IAB  1   Ectopic  0   Multiple      Live Births  2           Past Medical History:  Diagnosis Date   Hx of chlamydia infection 05/04/14   treated, TOC-neg   Hx of gonorrhea 05/04/14   treated, TOC-neg   Hx of wisdom tooth extraction     Past Surgical History:  Procedure Laterality Date   WISDOM TOOTH EXTRACTION      Family History  Adopted: Yes  Problem Relation Age of Onset   Cancer Mother        pseudotumor in brain    Social History   Tobacco Use   Smoking status: Never   Smokeless tobacco: Never  Substance Use Topics   Alcohol use: Yes    Alcohol/week: 1.0 standard drink of alcohol    Types: 1 Glasses of wine per week    Comment: 08/01/17 was last you use   Drug use: Yes    Types: Marijuana    Comment: 08/01/17 was last use    Allergies:  Allergies  Allergen Reactions   Bee Venom Rash    Medications Prior to Admission  Medication Sig Dispense Refill Last Dose/Taking   acetaminophen (TYLENOL) 500 MG tablet Take  1,000 mg by mouth every 6 (six) hours as needed for mild pain, moderate pain or headache.    Past Month   cyclobenzaprine (FLEXERIL) 10 MG tablet Take 1 tablet (10 mg total) by mouth 3 (three) times daily as needed for muscle spasms. 30 tablet 0 Past Month   Prenatal Vit-Fe Fumarate-FA (PRENATAL MULTIVITAMIN) TABS tablet Take 1 tablet by mouth daily at 12 noon.   Past Week   ferrous sulfate 325 (65 FE) MG tablet Take 1 tablet (325 mg total) by mouth every other day. (Patient not taking: Reported on 02/24/2023) 45 tablet 0 Not Taking    Review of Systems  Constitutional:  Negative for chills, fatigue and fever.  Eyes:  Negative for pain and visual disturbance.  Respiratory:  Negative for apnea, shortness of breath and wheezing.   Cardiovascular:  Negative for chest pain and palpitations.  Gastrointestinal:  Positive for abdominal pain. Negative for constipation, diarrhea, nausea and vomiting.  Genitourinary:  Negative for difficulty urinating, dysuria, pelvic pain, vaginal bleeding, vaginal discharge and vaginal pain.  Musculoskeletal:  Positive for back pain.  Neurological:  Negative for seizures, weakness and headaches.  Psychiatric/Behavioral:  Negative for suicidal ideas.    Physical Exam   Blood pressure 120/64, pulse (!) 123, temperature 98.4 F (36.9 C), resp. rate 17, height 5\' 2"  (1.575 m), weight 89.4 kg, SpO2 100%.  Physical Exam Vitals and nursing note reviewed. Exam conducted with a chaperone present.  Constitutional:      General: She is not in acute distress.    Appearance: Normal appearance.  HENT:     Head: Normocephalic.  Cardiovascular:     Rate and Rhythm: Tachycardia present.  Pulmonary:     Effort: Pulmonary effort is normal.  Abdominal:     Palpations: Abdomen is soft.     Tenderness: There is no abdominal tenderness.  Genitourinary:    Comments: Dilation: 3 Exam by:: Rhunette Croft CNM  Musculoskeletal:     Cervical back: Normal range of motion.  Skin:     General: Skin is warm and dry.  Neurological:     Mental Status: She is alert and oriented to person, place, and time.  Psychiatric:        Mood and Affect: Mood normal.    FHT: 145 bpm with moderate variability 15x15 accels present no decels  Toco: irregular contractions (patient reports that she is feeling "some of them" but are "tolerable" )   MAU Course  Procedures - Fetal Monitoring for 4 hours from incident.  - Patient provided a fetal clicker - [place]  ordered.   MDM - PO Tylenol given for pain. Offered flexeril but patient declined.  - CNM reviewed previous ED records from [facility] and patient was 3cm on that visit on 02/19/2023. Cervix unchanged. Abdomen soft and non-tender.  - PreLim [place] results showed no signs of abruption. Abdomen soft and non-tender and FHT appropriate for gestational age up to 4 hours after the fall.  - FHT remained Cat I during MAU visit  - plan for discharge.   Assessment and Plan   1. Fall, initial encounter   2. [redacted] weeks gestation of pregnancy   3. Generalized body aches    - Reviewed worsening signs and return precautions,  - Recommended comfort measures for aches and soreness. Encouraged the use of a muscle relaxer at home if she desired. Rx sent to outpatient pharmacy for pick up.  - FHT appropriate for gestational age at time of discharge.  - Patient discharged home in stable condition and may return to MAU as needed.   Claudette Head, MSN CNM  02/24/2023, 12:29 AM

## 2023-02-24 NOTE — Progress Notes (Signed)
PT talking on phone the entire time being asked questions and does respond to questions. Did not acknowledge ctx while talking on phone
